# Patient Record
Sex: Male | Born: 1940 | Race: White | Hispanic: No | Marital: Married | Smoking: Former smoker
Health system: Southern US, Community
[De-identification: ages and names within clinical notes are randomized; demographics above are authoritative.]

## PROBLEM LIST (undated history)

## (undated) DIAGNOSIS — M1990 Unspecified osteoarthritis, unspecified site: Secondary | ICD-10-CM

## (undated) DIAGNOSIS — N400 Enlarged prostate without lower urinary tract symptoms: Secondary | ICD-10-CM

## (undated) DIAGNOSIS — K219 Gastro-esophageal reflux disease without esophagitis: Secondary | ICD-10-CM

## (undated) DIAGNOSIS — T7840XA Allergy, unspecified, initial encounter: Secondary | ICD-10-CM

## (undated) DIAGNOSIS — I10 Essential (primary) hypertension: Secondary | ICD-10-CM

## (undated) DIAGNOSIS — Z800 Family history of malignant neoplasm of digestive organs: Secondary | ICD-10-CM

## (undated) HISTORY — DX: Essential (primary) hypertension: I10

## (undated) HISTORY — PX: BLEPHAROPLASTY: SUR158

## (undated) HISTORY — DX: Family history of malignant neoplasm of digestive organs: Z80.0

## (undated) HISTORY — DX: Gastro-esophageal reflux disease without esophagitis: K21.9

## (undated) HISTORY — PX: COLONOSCOPY WITH PROPOFOL: SHX5780

## (undated) HISTORY — DX: Benign prostatic hyperplasia without lower urinary tract symptoms: N40.0

## (undated) HISTORY — DX: Allergy, unspecified, initial encounter: T78.40XA

---
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
## 2007-12-07 ENCOUNTER — Ambulatory Visit: Payer: Self-pay | Admitting: Gastroenterology

## 2007-12-17 ENCOUNTER — Ambulatory Visit: Payer: Self-pay | Admitting: Gastroenterology

## 2012-10-10 ENCOUNTER — Encounter: Payer: Self-pay | Admitting: *Deleted

## 2012-10-17 ENCOUNTER — Encounter: Payer: Self-pay | Admitting: Gastroenterology

## 2013-03-20 ENCOUNTER — Encounter: Payer: Self-pay | Admitting: Gastroenterology

## 2013-05-14 ENCOUNTER — Ambulatory Visit (AMBULATORY_SURGERY_CENTER): Payer: Self-pay | Admitting: *Deleted

## 2013-05-14 VITALS — Ht 68.0 in | Wt 177.6 lb

## 2013-05-14 DIAGNOSIS — Z800 Family history of malignant neoplasm of digestive organs: Secondary | ICD-10-CM

## 2013-05-14 MED ORDER — MOVIPREP 100 G PO SOLR: ORAL | Status: DC

## 2013-05-14 NOTE — Progress Notes (Signed)
No allergies to eggs or soy. No problems with anesthesia.  

## 2013-05-20 ENCOUNTER — Encounter: Payer: Self-pay | Admitting: Gastroenterology

## 2013-05-24 ENCOUNTER — Encounter: Payer: Self-pay | Admitting: Gastroenterology

## 2013-05-24 ENCOUNTER — Ambulatory Visit (AMBULATORY_SURGERY_CENTER): Payer: Medicare Other | Admitting: Gastroenterology

## 2013-05-24 VITALS — BP 141/71 | HR 66 | Temp 97.1°F | Resp 18 | Ht 68.0 in | Wt 177.0 lb

## 2013-05-24 DIAGNOSIS — D126 Benign neoplasm of colon, unspecified: Secondary | ICD-10-CM

## 2013-05-24 DIAGNOSIS — Z1211 Encounter for screening for malignant neoplasm of colon: Secondary | ICD-10-CM

## 2013-05-24 DIAGNOSIS — Z800 Family history of malignant neoplasm of digestive organs: Secondary | ICD-10-CM

## 2013-05-24 MED ORDER — SODIUM CHLORIDE 0.9 % IV SOLN: 500.0000 mL | INTRAVENOUS | Status: DC

## 2013-05-24 NOTE — Progress Notes (Signed)
Called to room to assist during endoscopic procedure.  Patient ID and intended procedure confirmed with present staff. Received instructions for my participation in the procedure from the performing physician.  

## 2013-05-24 NOTE — Patient Instructions (Signed)
YOU HAD AN ENDOSCOPIC PROCEDURE TODAY AT THE [facility]: Refer to the procedure report that was given to you for any specific questions about what was found during the examination.  If the procedure report does not answer your questions, please call your gastroenterologist to clarify.  If you requested that your care partner not be given the details of your procedure findings, then the procedure report has been included in a sealed envelope for you to review at your convenience later.  YOU SHOULD EXPECT: Some feelings of bloating in the abdomen. Passage of more gas than usual.  Walking can help get rid of the air that was put into your GI tract during the procedure and reduce the bloating. If you had a lower endoscopy (such as a colonoscopy or flexible sigmoidoscopy) you may notice spotting of blood in your stool or on the toilet paper. If you underwent a bowel prep for your procedure, then you may not have a normal bowel movement for a few days.  DIET: Your first meal following the procedure should be a light meal and then it is ok to progress to your normal diet.  A half-sandwich or bowl of soup is an example of a good first meal.  Heavy or fried foods are harder to digest and may make you feel nauseous or bloated.  Likewise meals heavy in dairy and vegetables can cause extra gas to form and this can also increase the bloating.  Drink plenty of fluids but you should avoid alcoholic beverages for 24 hours.  ACTIVITY: Your care partner should take you home directly after the procedure.  You should plan to take it easy, moving slowly for the rest of the day.  You can resume normal activity the day after the procedure however you should NOT DRIVE or use heavy machinery for 24 hours (because of the sedation medicines used during the test).    SYMPTOMS TO REPORT IMMEDIATELY: A gastroenterologist can be reached at any hour.  During normal business hours, 8:30 AM to 5:00 PM Monday through Friday,  call [phone].  After hours and on weekends, please call the GI answering service at [phone] who will take a message and have the physician on call contact you.   Following lower endoscopy (colonoscopy or flexible sigmoidoscopy):  Excessive amounts of blood in the stool  Significant tenderness or worsening of abdominal pains  Swelling of the abdomen that is new, acute  Fever of 100F or higher  FOLLOW UP: If any biopsies were taken you will be contacted by phone or by letter within the next 1-3 weeks.  Call your gastroenterologist if you have not heard about the biopsies in 3 weeks.  Our staff will call the home number listed on your records the next business day following your procedure to check on you and address any questions or concerns that you may have at that time regarding the information given to you following your procedure. This is a courtesy call and so if there is no answer at the home number and we have not heard from you through the emergency physician on call, we will assume that you have returned to your regular daily activities without incident.  SIGNATURES/CONFIDENTIALITY: You and/or your care partner have signed paperwork which will be entered into your electronic medical record.  These signatures attest to the fact that that the information above on your After Visit Summary has been reviewed and is understood.  Full responsibility of the confidentiality of this   discharge information lies with you and/or your care-partner.  Recommendations See procedure report  

## 2013-05-24 NOTE — Op Note (Signed)
[facility] [address] [place], [address]   COLONOSCOPY PROCEDURE REPORT  PATIENT: John Long, John Long  MR#: [id number] BIRTHDATE: May 05, 1941 , 72  yrs. old GENDER: Male ENDOSCOPIST: Mardella Layman, MD, [facility] REFERRED BY: PROCEDURE DATE:  05/24/2013 PROCEDURE:   Colonoscopy, screening First Screening Colonoscopy - Avg.  risk and is 50 yrs.  old or older - No.      History of Adenoma - Now for follow-up colonoscopy & has been > or = to 3 yrs.  N/A  Polyps Removed Today? Yes. ASA CLASS:   Class II INDICATIONS:Patient's immediate family history of colon cancer. MEDICATIONS: propofol (Diprivan) 200mg  IV  DESCRIPTION OF PROCEDURE:   After the risks benefits and alternatives of the procedure were thoroughly explained, informed consent was obtained.  A digital rectal exam revealed no abnormalities of the rectum.   The LB [id number]  endoscope was introduced through the anus and advanced to the cecum, which was identified by both the appendix and ileocecal valve. No adverse events experienced.   Limited by a tortuous and redundant colon. The quality of the prep was excellent, using MoviPrep  The instrument was then slowly withdrawn as the colon was fully examined.      COLON FINDINGS: A smooth sessile polyp was found at the cecum.  A polypectomy was performed with cold forceps.  The resection was complete and the polyp tissue was completely retrieved. Retroflexed views revealed no abnormalities. The time to cecum=6 minutes 44 seconds.  Withdrawal time=6 minutes 0 seconds.  The scope was withdrawn and the procedure completed. COMPLICATIONS: There were no complications.  ENDOSCOPIC IMPRESSION: 1.   Sessile polyp was found at the cecum; polypectomy was performed with cold forceps 2.   Normal colonoscopy otherwise  RECOMMENDATIONS: 1.  Await pathology results 2.  Repeat Colonoscopy in 5 years.   eSigned:  Mardella Layman, MD, [facility] 05/24/2013  1:54 PM   cc:

## 2013-05-27 ENCOUNTER — Telehealth: Payer: Self-pay | Admitting: *Deleted

## 2013-05-27 NOTE — Telephone Encounter (Signed)
  Follow up Call-  Call back number 05/24/2013  Post procedure Call Back phone  # [phone]  Permission to leave phone message Yes     Patient questions:  Do you have a fever, pain , or abdominal swelling? no Pain Score  0 *  Have you tolerated food without any problems? yes  Have you been able to return to your normal activities? yes  Do you have any questions about your discharge instructions: Diet   no Medications  no Follow up visit  no  Do you have questions or concerns about your Care? no  Actions: * If pain score is 4 or above: No action needed, pain <4.

## 2013-05-28 ENCOUNTER — Encounter: Payer: Self-pay | Admitting: Gastroenterology

## 2014-03-07 ENCOUNTER — Encounter: Payer: Self-pay | Admitting: Gastroenterology

## 2017-07-24 HISTORY — PX: HERNIA REPAIR: SHX51

## 2018-02-05 ENCOUNTER — Emergency Department: Payer: Medicare Other

## 2018-02-05 ENCOUNTER — Emergency Department
Admission: EM | Admit: 2018-02-05 | Discharge: 2018-02-05 | Disposition: A | Discharge status: Home / Self Care | Payer: Medicare Other | Attending: Emergency Medicine | Admitting: Emergency Medicine

## 2018-02-05 ENCOUNTER — Other Ambulatory Visit: Payer: Self-pay

## 2018-02-05 ENCOUNTER — Encounter: Payer: Self-pay | Admitting: Emergency Medicine

## 2018-02-05 DIAGNOSIS — Z79899 Other long term (current) drug therapy: Secondary | ICD-10-CM | POA: Insufficient documentation

## 2018-02-05 DIAGNOSIS — W19XXXA Unspecified fall, initial encounter: Secondary | ICD-10-CM | POA: Diagnosis not present

## 2018-02-05 DIAGNOSIS — S52501A Unspecified fracture of the lower end of right radius, initial encounter for closed fracture: Secondary | ICD-10-CM | POA: Insufficient documentation

## 2018-02-05 DIAGNOSIS — S59911A Unspecified injury of right forearm, initial encounter: Secondary | ICD-10-CM | POA: Diagnosis present

## 2018-02-05 DIAGNOSIS — Z87891 Personal history of nicotine dependence: Secondary | ICD-10-CM | POA: Insufficient documentation

## 2018-02-05 DIAGNOSIS — Y998 Other external cause status: Secondary | ICD-10-CM | POA: Insufficient documentation

## 2018-02-05 DIAGNOSIS — Y9239 Other specified sports and athletic area as the place of occurrence of the external cause: Secondary | ICD-10-CM | POA: Diagnosis not present

## 2018-02-05 DIAGNOSIS — Y9354 Activity, bowling: Secondary | ICD-10-CM | POA: Insufficient documentation

## 2018-02-05 DIAGNOSIS — I10 Essential (primary) hypertension: Secondary | ICD-10-CM | POA: Insufficient documentation

## 2018-02-05 IMAGING — DX DG WRIST COMPLETE 3+V*L*
3 series · 3 of 3 positions shown · non-contrast
Comparison: None.

CLINICAL DATA: The patient suffered a fall with a left wrist injury
while bowling today. Initial encounter.

EXAM:
LEFT WRIST - COMPLETE 3+ VIEW

[wrist ap]
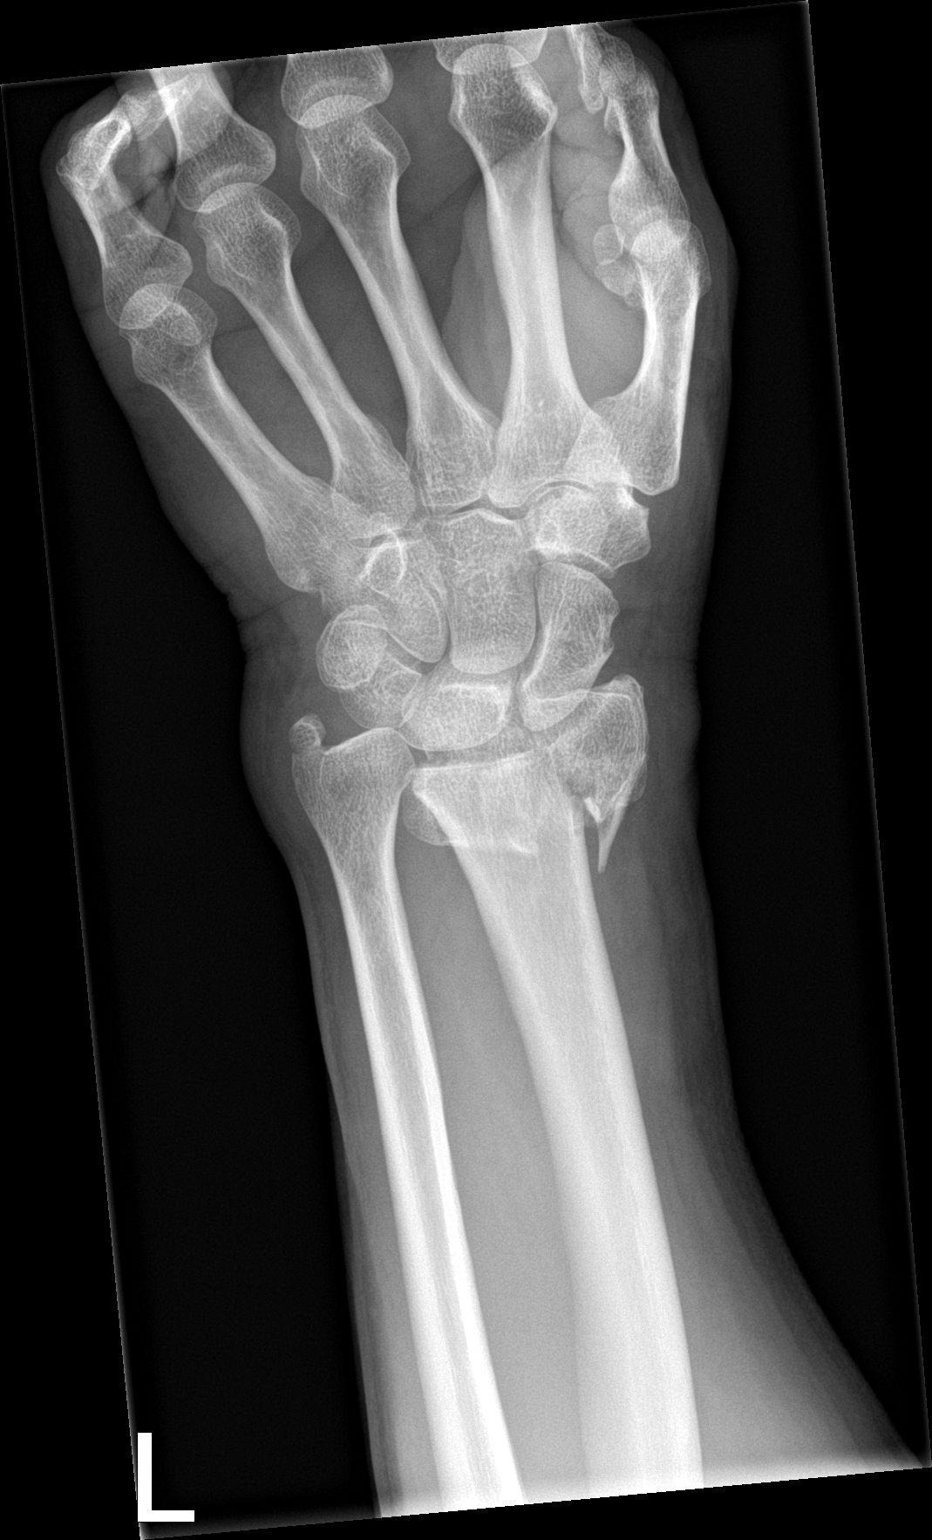

[wrist obl]
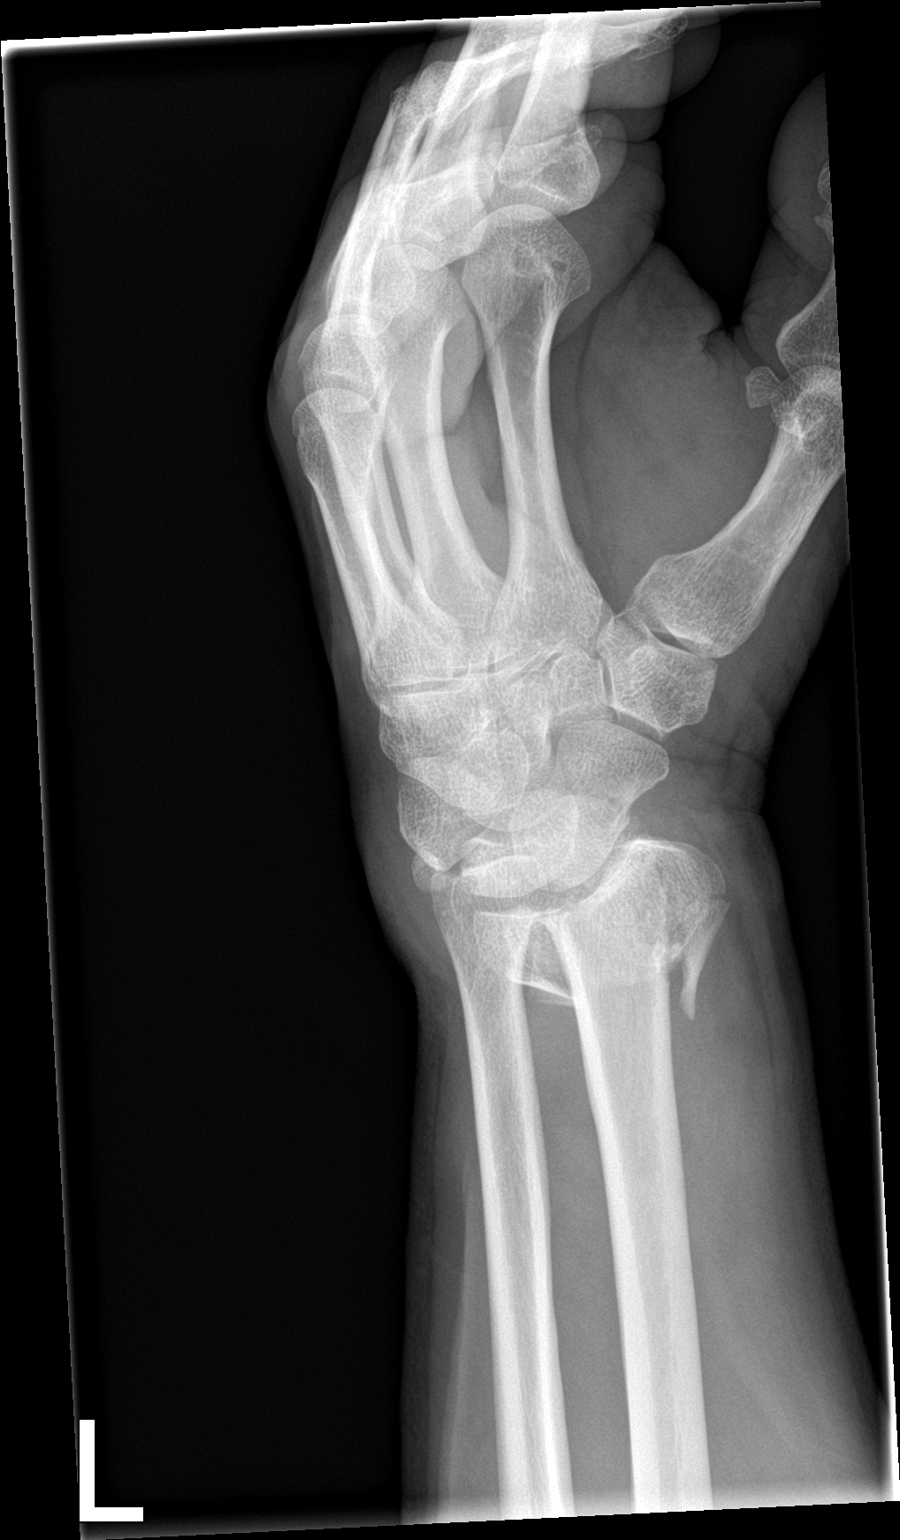

[wrist lat]
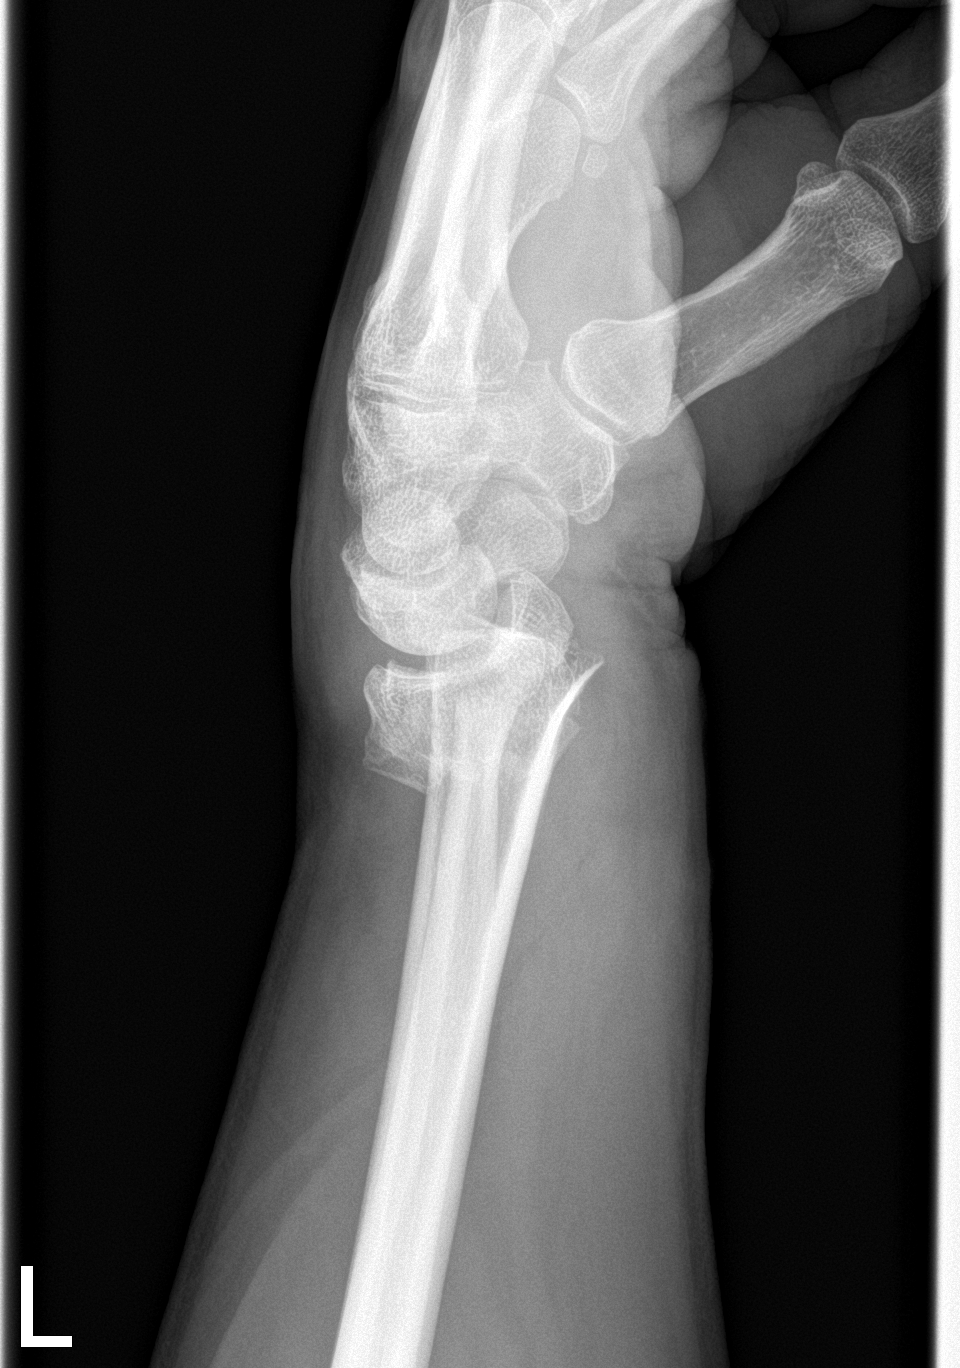

[3 of 3 positions shown; findings below may reference images not displayed]

FINDINGS: The patient has an acute distal radius fracture with dorsal
displacement and angulation. The fracture is comminuted and involves
the articular surface. Nondisplaced fracture of the base of the
radial styloid is also noted. Soft tissue swelling is present about
the wrist.
IMPRESSION: Acute distal radius and ulnar styloid fractures as described above.

## 2018-02-05 MED ORDER — ONDANSETRON 4 MG PO TBDP
4.0000 mg | ORAL_TABLET | Freq: Once | ORAL | Status: AC
  Filled 2018-02-05: qty 1

## 2018-02-05 MED ORDER — HYDROMORPHONE HCL 1 MG/ML IJ SOLN
1.0000 mg | Freq: Once | INTRAMUSCULAR | Status: AC
  Filled 2018-02-05: qty 1

## 2018-02-05 MED ORDER — OXYCODONE-ACETAMINOPHEN 5-325 MG PO TABS: 1.0000 | ORAL_TABLET | ORAL | 0 refills | Status: DC | PRN

## 2018-02-05 NOTE — ED Notes (Signed)
Splint and sling have been applied by ed tech keith.  Sling in place.

## 2018-02-05 NOTE — ED Notes (Signed)
States he would rather not have the dilaudid.  Says his pain is not that bad and as long as he is getting rx for med he will wait and see.

## 2018-02-05 NOTE — ED Notes (Signed)
Has ice pack in place.  Good sensation and circulation to fingers on left hand.  Awaiting xray.

## 2018-02-05 NOTE — ED Provider Notes (Signed)
[facility] Emergency Department Provider Note   ____________________________________________   First MD Initiated Contact with Patient 02/05/18 1234     (approximate)  I have reviewed the triage vital signs and the nursing notes.   HISTORY  Chief Complaint Fall and Arm Injury    HPI John Long. is a 77 y.o. male patient presents with pain and edema to the left wrist secondary to falling while bowling this morning.  Patient denies LOC.  Patient denies loss of sensation.  Patient state decreased range of motion secondary to complaint of pain.  Patient rates pain as a 4/10.  Patient described pain is "aching".  Ice was applied prior to arrival.  Past Medical History:  Diagnosis Date  . Allergy   . Enlarged prostate   . Family history of colon cancer   . GERD (gastroesophageal reflux disease)   . Hypertension     There are no active problems to display for this patient.   Past Surgical History:  Procedure Laterality Date  . TONSILLECTOMY  1949    Prior to Admission medications   Medication Sig Start Date End Date Taking? Authorizing Provider  amLODipine (NORVASC) 2.5 MG tablet Take 2.5 mg by mouth daily.   Yes [provider]  finasteride (PROSCAR) 5 MG tablet Take 5 mg by mouth daily.   Yes [provider]  ibuprofen (ADVIL,MOTRIN) 400 MG tablet Take 400 mg by mouth every 6 (six) hours as needed.   Yes [provider]  loratadine (CLARITIN) 10 MG tablet Take 10 mg by mouth daily as needed for allergies.   Yes [provider]  sildenafil (REVATIO) 20 MG tablet Take 20 mg by mouth as needed.   Yes [provider]  sodium chloride 1 g tablet Take 1 g by mouth 3 (three) times daily.   Yes [provider]  telmisartan (MICARDIS) 80 MG tablet Take 80 mg by mouth daily.   Yes [provider]  aspirin 81 MG tablet Take 81 mg by mouth daily.    [provider]  cetirizine  (ZYRTEC) 10 MG tablet Take 10 mg by mouth daily as needed for allergies.    [provider]  losartan-hydrochlorothiazide (HYZAAR) 100-25 MG per tablet Take 1 tablet by mouth daily.    [provider]  Multiple Vitamin (MULTIVITAMIN) tablet Take 1 tablet by mouth daily.    [provider]  omeprazole (PRILOSEC) 20 MG capsule Take 20 mg by mouth daily.    [provider]  oxyCODONE-acetaminophen (PERCOCET) 5-325 MG tablet Take 1 tablet by mouth every 4 (four) hours as needed for severe pain. 02/05/18 02/05/19  Sable Feil, PA-C    Allergies Patient has no known allergies.  Family History  Problem Relation Age of Onset  . Colon cancer Father 75  . Esophageal cancer Neg Hx   . Stomach cancer Neg Hx   . Rectal cancer Neg Hx     Social History Social History   Tobacco Use  . Smoking status: Former Smoker    Last attempt to quit: 06/20/1992    Years since quitting: 25.6  . Smokeless tobacco: Never Used  Substance Use Topics  . Alcohol use: Yes    Alcohol/week: 14.0 standard drinks    Types: 14 Glasses of wine per week  . Drug use: No    Review of Systems Constitutional: No fever/chills Eyes: No visual changes. ENT: No sore throat. Cardiovascular: Denies chest pain.  Hypertension Respiratory: Denies  shortness of breath. Gastrointestinal: No abdominal pain.  No nausea, no vomiting.  No diarrhea.  No constipation. Genitourinary: Negative for dysuria.  BPH Musculoskeletal: Negative for back pain. Skin: Negative for rash. Neurological: Negative for headaches, focal weakness or numbness. Endocrine:Hypertension   ____________________________________________   PHYSICAL EXAM:  VITAL SIGNS: ED Triage Vitals  Enc Vitals Group     BP 02/05/18 1217 (!) 151/92     Pulse Rate 02/05/18 1217 72     Resp 02/05/18 1217 18     Temp 02/05/18 1217 98.2 F (36.8 C)     Temp Source 02/05/18 1217 Oral     SpO2 02/05/18 1217 98 %     Weight 02/05/18  1203 168 lb (76.2 kg)     Height 02/05/18 1203 5\' 7"  (1.702 m)     Head Circumference --      Peak Flow --      Pain Score 02/05/18 1202 4     Pain Loc --      Pain Edu? --      Excl. in [place]? --    Constitutional: Alert and oriented. Well appearing and in no acute distress. Eyes: Conjunctivae are normal. PERRL. EOMI. Head: Atraumatic. Neck: No stridor.  No cervical spine tenderness to palpation. Hematological/Lymphatic/Immunilogical: No cervical lymphadenopathy. Cardiovascular: Normal rate, regular rhythm. Grossly normal heart sounds.  Good peripheral circulation. Respiratory: Normal respiratory effort.  No retractions. Lungs CTAB. Musculoskeletal: Obvious deformity to the distal radius left wrist. Neurologic:  Normal speech and language. No gross focal neurologic deficits are appreciated. No gait instability. Skin:  Skin is warm, dry and intact. No rash noted. Psychiatric: Mood and affect are normal. Speech and behavior are normal.  ____________________________________________   LABS (all labs ordered are listed, but only abnormal results are displayed)  Labs Reviewed - No data to display ____________________________________________  EKG   ____________________________________________  RADIOLOGY  ED MD interpretation:    Official radiology report(s): Dg Wrist Complete Left  Result Date: 02/05/2018 CLINICAL DATA:  The patient suffered a fall with a left wrist injury while bowling today. Initial encounter. EXAM: LEFT WRIST - COMPLETE 3+ VIEW COMPARISON:  None. FINDINGS: The patient has an acute distal radius fracture with dorsal displacement and angulation. The fracture is comminuted and involves the articular surface. Nondisplaced fracture of the base of the radial styloid is also noted. Soft tissue swelling is present about the wrist. IMPRESSION: Acute distal radius and ulnar styloid fractures as described above. Electronically Signed   By: Inge Rise M.D.   On:  02/05/2018 12:30    ____________________________________________   PROCEDURES  Procedure(s) performed:   .Splint Application Date/Time: 7/34/1937 12:49 PM Performed by: Luciana Axe, NT Authorized by: Sable Feil, PA-C   Consent:    Consent obtained:  Verbal   Consent given by:  Patient   Risks discussed:  Numbness, pain and swelling Pre-procedure details:    Sensation:  Normal Procedure details:    Laterality:  Left   Location:  Wrist   Wrist:  L wrist   Cast type:  Short arm   Supplies:  Ortho-Glass and sling Post-procedure details:    Pain:  Unchanged   Sensation:  Normal   Patient tolerance of procedure:  Tolerated well, no immediate complications    Critical Care performed: No  ____________________________________________   INITIAL IMPRESSION / ASSESSMENT AND PLAN / ED COURSE  As part of my medical decision making, I reviewed the following data within the [place]  Left wrist pain secondary to distal radius fracture with dorsal displacement angulation.  Discussed patient with Dr. Marry Guan, who is the orthopedic on-call.  Advised to splint and sling.  Patient will follow-up with orthopedic clinic by calling for an appointment this afternoon.      ____________________________________________   FINAL CLINICAL IMPRESSION(S) / ED DIAGNOSES  Final diagnoses:  Closed fracture of distal end of right radius, unspecified fracture morphology, initial encounter     ED Discharge Orders         Ordered    oxyCODONE-acetaminophen (PERCOCET) 5-325 MG tablet  Every 4 hours PRN     02/05/18 1257           Note:  This document was prepared using Dragon voice recognition software and may include unintentional dictation errors.    Sable Feil, PA-C 02/05/18 1305    Harvest Dark, MD 02/05/18 1410

## 2018-02-05 NOTE — ED Triage Notes (Signed)
Pt reports was bowling before coming here and fell hurting his left wrist. Pt with swelling and obvious deformity noted to left wrist. Denies LOC

## 2018-02-05 NOTE — Discharge Instructions (Signed)
Wear splint and sling until evaluation by Ortho clinic.  Call this afternoon to schedule an appointment.  Advised that you are a follow-up from the emergency room.

## 2018-02-07 MED ORDER — CEFAZOLIN SODIUM-DEXTROSE 2-4 GM/100ML-% IV SOLN
2.0000 g | Freq: Once | INTRAVENOUS | Status: AC
  Administered 2018-02-08: 2 g via INTRAVENOUS

## 2018-02-08 ENCOUNTER — Encounter
Admission: RE | Disposition: A | Discharge status: Home / Self Care | Payer: Self-pay | Source: Ambulatory Visit | Attending: Orthopedic Surgery

## 2018-02-08 ENCOUNTER — Ambulatory Visit: Payer: Medicare Other | Admitting: Anesthesiology

## 2018-02-08 ENCOUNTER — Ambulatory Visit
Admission: RE | Admit: 2018-02-08 | Discharge: 2018-02-08 | Disposition: A | Discharge status: Home / Self Care | Payer: Medicare Other | Source: Ambulatory Visit | Attending: Orthopedic Surgery | Admitting: Orthopedic Surgery

## 2018-02-08 ENCOUNTER — Other Ambulatory Visit: Payer: Self-pay

## 2018-02-08 ENCOUNTER — Ambulatory Visit: Payer: Medicare Other

## 2018-02-08 DIAGNOSIS — W1839XA Other fall on same level, initial encounter: Secondary | ICD-10-CM | POA: Diagnosis not present

## 2018-02-08 DIAGNOSIS — I10 Essential (primary) hypertension: Secondary | ICD-10-CM | POA: Diagnosis not present

## 2018-02-08 DIAGNOSIS — Z888 Allergy status to other drugs, medicaments and biological substances status: Secondary | ICD-10-CM | POA: Diagnosis not present

## 2018-02-08 DIAGNOSIS — Z8781 Personal history of (healed) traumatic fracture: Secondary | ICD-10-CM

## 2018-02-08 DIAGNOSIS — K219 Gastro-esophageal reflux disease without esophagitis: Secondary | ICD-10-CM | POA: Diagnosis not present

## 2018-02-08 DIAGNOSIS — Z79899 Other long term (current) drug therapy: Secondary | ICD-10-CM | POA: Diagnosis not present

## 2018-02-08 DIAGNOSIS — Z87891 Personal history of nicotine dependence: Secondary | ICD-10-CM | POA: Diagnosis not present

## 2018-02-08 DIAGNOSIS — Z7982 Long term (current) use of aspirin: Secondary | ICD-10-CM | POA: Diagnosis not present

## 2018-02-08 DIAGNOSIS — S52572A Other intraarticular fracture of lower end of left radius, initial encounter for closed fracture: Secondary | ICD-10-CM | POA: Diagnosis present

## 2018-02-08 DIAGNOSIS — Y929 Unspecified place or not applicable: Secondary | ICD-10-CM | POA: Insufficient documentation

## 2018-02-08 DIAGNOSIS — Z9889 Other specified postprocedural states: Secondary | ICD-10-CM

## 2018-02-08 DIAGNOSIS — Y9354 Activity, bowling: Secondary | ICD-10-CM | POA: Insufficient documentation

## 2018-02-08 HISTORY — PX: OPEN REDUCTION INTERNAL FIXATION (ORIF) DISTAL RADIAL FRACTURE: SHX5989

## 2018-02-08 LAB — CBC WITH DIFFERENTIAL/PLATELET
BASOS PCT: 0 %
Basophils Absolute: 0 10*3/uL (ref 0–0.1)
EOS ABS: 0.2 10*3/uL (ref 0–0.7)
Eosinophils Relative: 2 %
HCT: 41.6 % (ref 40.0–52.0)
HEMOGLOBIN: 14.2 g/dL (ref 13.0–18.0)
Lymphocytes Relative: 13 %
Lymphs Abs: 1.1 10*3/uL (ref 1.0–3.6)
MCH: 29.9 pg (ref 26.0–34.0)
MCHC: 34 g/dL (ref 32.0–36.0)
MCV: 87.9 fL (ref 80.0–100.0)
MONO ABS: 0.6 10*3/uL (ref 0.2–1.0)
Monocytes Relative: 7 %
NEUTROS PCT: 78 %
Neutro Abs: 6.9 10*3/uL — ABNORMAL HIGH (ref 1.4–6.5)
PLATELETS: 290 10*3/uL (ref 150–440)
RBC: 4.73 MIL/uL (ref 4.40–5.90)
RDW: 13.2 % (ref 11.5–14.5)
WBC: 8.8 10*3/uL (ref 3.8–10.6)

## 2018-02-08 LAB — BASIC METABOLIC PANEL
Anion gap: 11 (ref 5–15)
BUN: 15 mg/dL (ref 8–23)
CALCIUM: 9.2 mg/dL (ref 8.9–10.3)
CO2: 28 mmol/L (ref 22–32)
CREATININE: 0.86 mg/dL (ref 0.61–1.24)
Chloride: 97 mmol/L — ABNORMAL LOW (ref 98–111)
Glucose, Bld: 111 mg/dL — ABNORMAL HIGH (ref 70–99)
Potassium: 4.5 mmol/L (ref 3.5–5.1)
SODIUM: 136 mmol/L (ref 135–145)

## 2018-02-08 IMAGING — DX DG WRIST 2V*L*
2 series · 2 of 2 positions shown · non-contrast
Comparison: None.

CLINICAL DATA: ORIF LEFT wrist fracture

EXAM:
LEFT WRIST - 2 VIEW

[wrist ap]
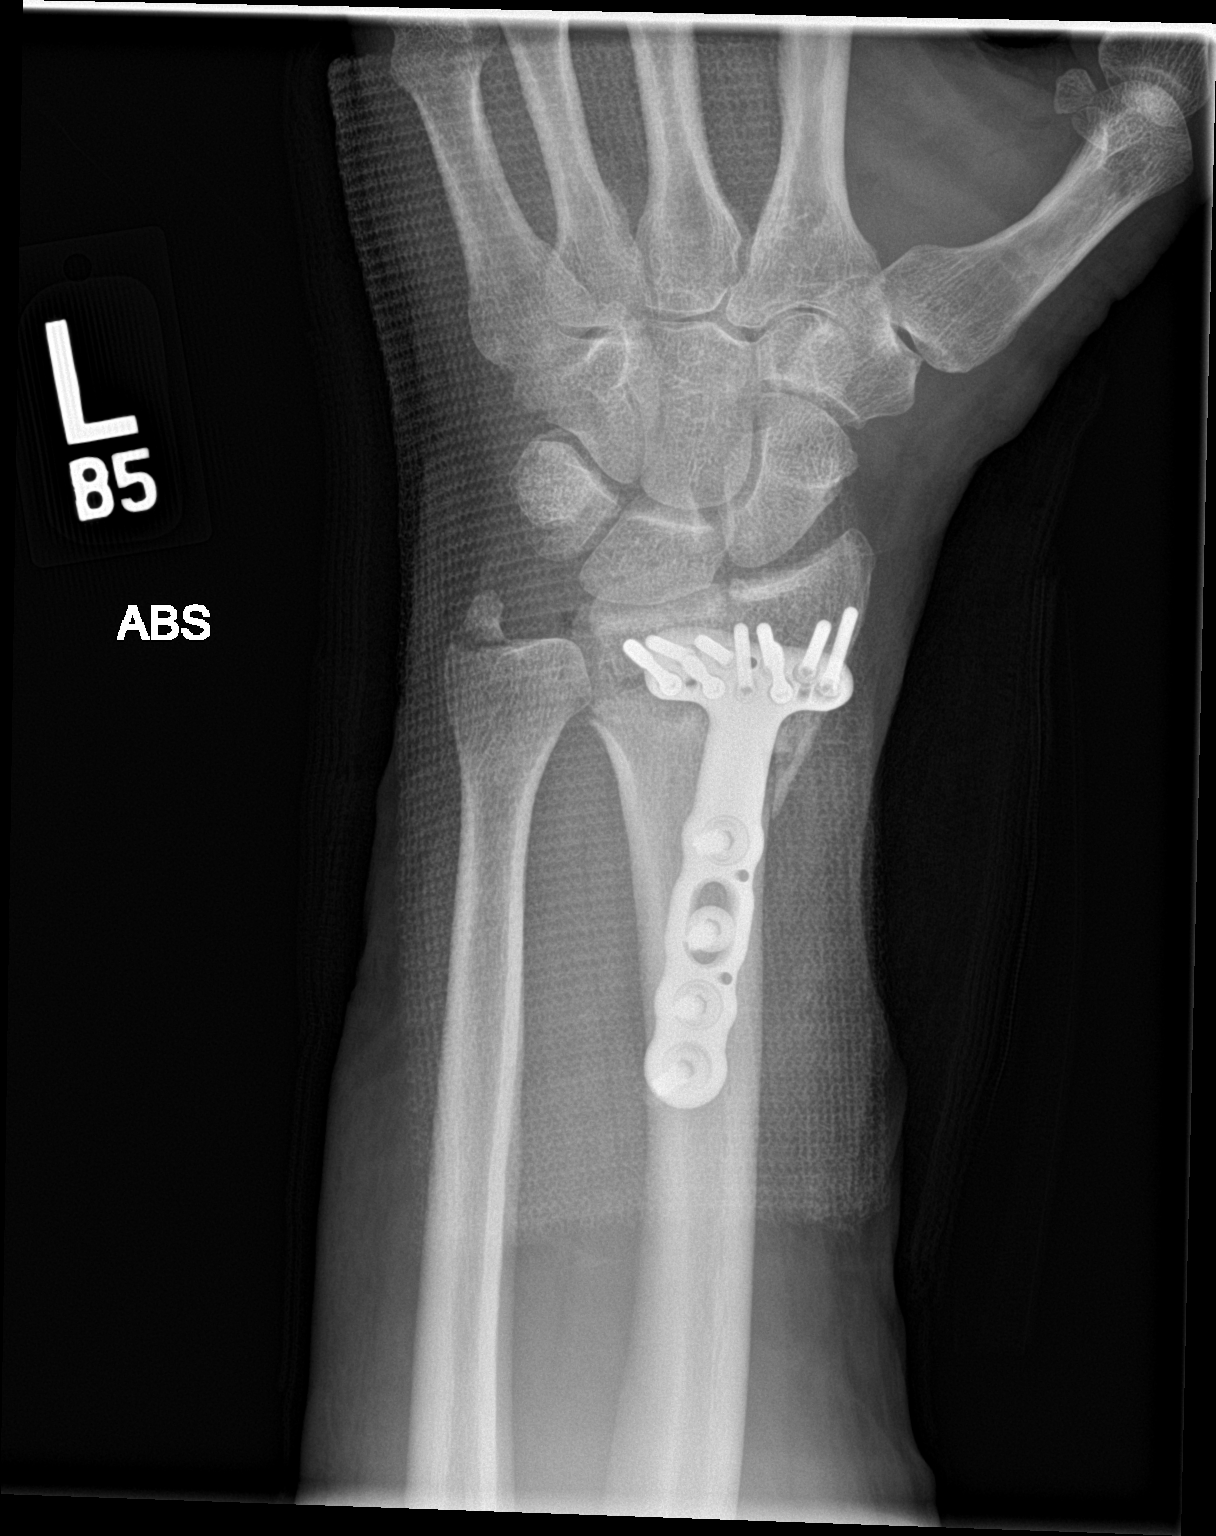

[wrist lat]
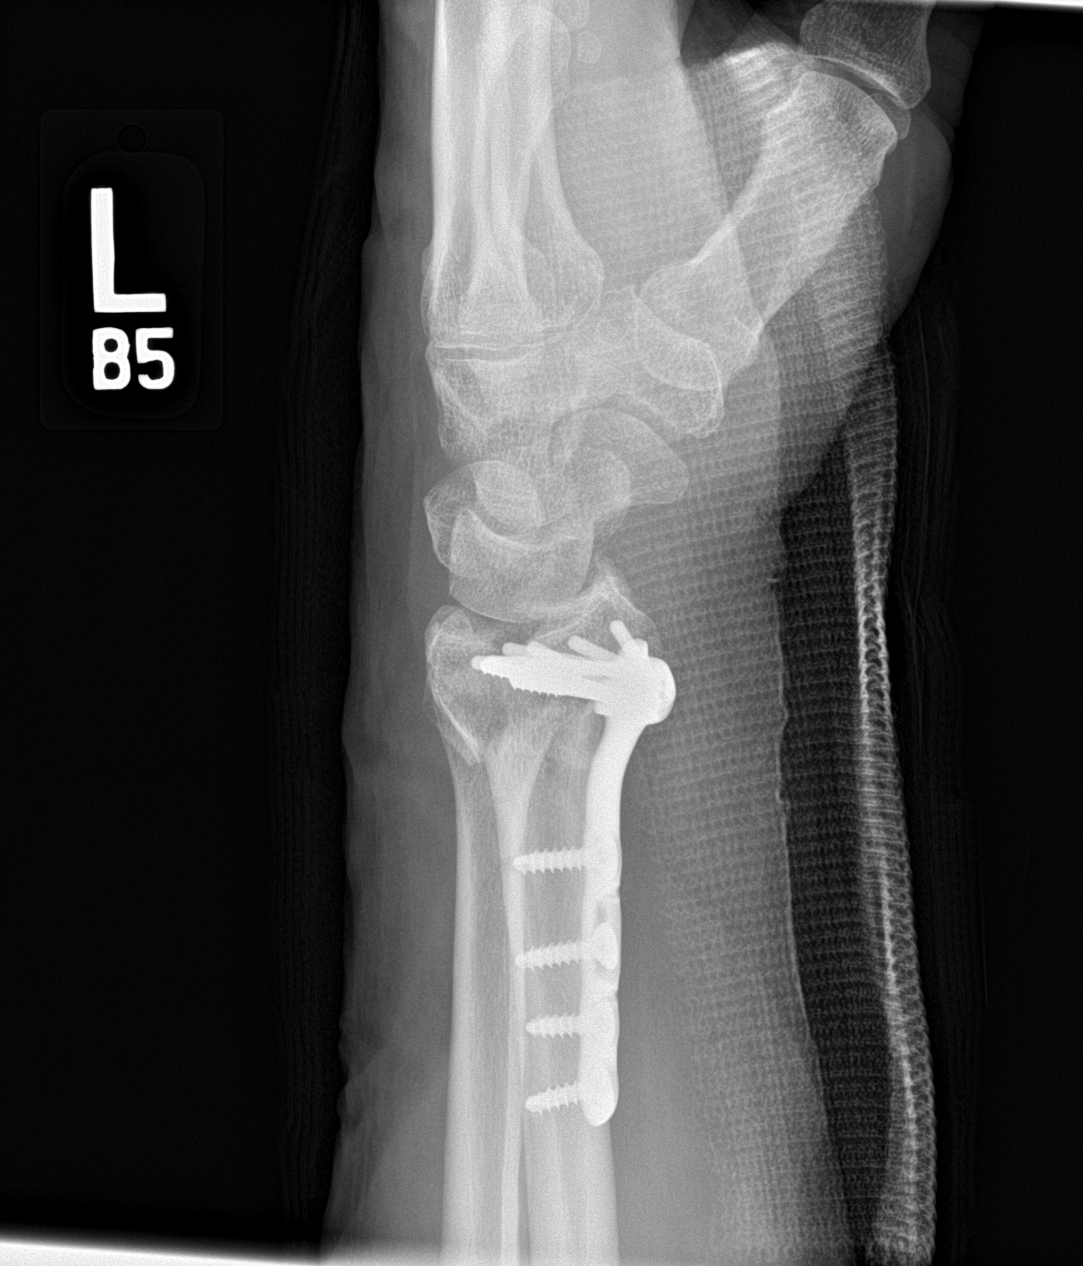

[2 of 2 positions shown; findings below may reference images not displayed]

FINDINGS: Internal plate and screw fixation traverses a comminuted
intra-articular distal radial fracture, in near-anatomic alignment
and position. No gross complicating features are noted.

Ulnar styloid fracture again noted.
IMPRESSION: ORIF distal radial fracture, in near-anatomic alignment and
position.

## 2018-02-08 SURGERY — OPEN REDUCTION INTERNAL FIXATION (ORIF) DISTAL RADIUS FRACTURE
Anesthesia: General | Site: Wrist | Laterality: Left | Wound class: Clean

## 2018-02-08 MED ORDER — FENTANYL CITRATE (PF) 100 MCG/2ML IJ SOLN
INTRAMUSCULAR | Status: DC | PRN
  Administered 2018-02-08: 50 ug via INTRAVENOUS
  Administered 2018-02-08 (×2): 25 ug via INTRAVENOUS

## 2018-02-08 MED ORDER — HYDROCODONE-ACETAMINOPHEN 5-325 MG PO TABS: 1.0000 | ORAL_TABLET | ORAL | Status: DC | PRN

## 2018-02-08 MED ORDER — NEOMYCIN-POLYMYXIN B GU 40-200000 IR SOLN
Status: DC | PRN
  Administered 2018-02-08: 2 mL

## 2018-02-08 MED ORDER — PROPOFOL 500 MG/50ML IV EMUL
INTRAVENOUS | Status: DC | PRN
  Administered 2018-02-08: 50 ug/kg/min via INTRAVENOUS

## 2018-02-08 MED ORDER — FENTANYL CITRATE (PF) 100 MCG/2ML IJ SOLN
INTRAMUSCULAR | Status: AC
  Administered 2018-02-08: 50 ug via INTRAVENOUS
  Filled 2018-02-08: qty 2

## 2018-02-08 MED ORDER — PROPOFOL 10 MG/ML IV BOLUS
INTRAVENOUS | Status: AC
  Filled 2018-02-08: qty 20

## 2018-02-08 MED ORDER — FENTANYL CITRATE (PF) 100 MCG/2ML IJ SOLN: 25.0000 ug | INTRAMUSCULAR | Status: DC | PRN

## 2018-02-08 MED ORDER — ROPIVACAINE HCL 5 MG/ML IJ SOLN
INTRAMUSCULAR | Status: AC
  Filled 2018-02-08: qty 30

## 2018-02-08 MED ORDER — ONDANSETRON HCL 4 MG/2ML IJ SOLN: 4.0000 mg | Freq: Four times a day (QID) | INTRAMUSCULAR | Status: DC | PRN

## 2018-02-08 MED ORDER — FENTANYL CITRATE (PF) 100 MCG/2ML IJ SOLN
50.0000 ug | Freq: Once | INTRAMUSCULAR | Status: AC
  Administered 2018-02-08: 50 ug via INTRAVENOUS

## 2018-02-08 MED ORDER — MIDAZOLAM HCL 2 MG/2ML IJ SOLN
INTRAMUSCULAR | Status: AC
  Administered 2018-02-08: 1 mg via INTRAVENOUS
  Filled 2018-02-08: qty 2

## 2018-02-08 MED ORDER — OXYCODONE HCL 5 MG/5ML PO SOLN: 5.0000 mg | Freq: Once | ORAL | Status: DC | PRN

## 2018-02-08 MED ORDER — LIDOCAINE HCL (PF) 1 % IJ SOLN
INTRAMUSCULAR | Status: DC | PRN
  Administered 2018-02-08: 1 mL via INTRADERMAL

## 2018-02-08 MED ORDER — LIDOCAINE HCL (PF) 1 % IJ SOLN
INTRAMUSCULAR | Status: AC
  Filled 2018-02-08: qty 5

## 2018-02-08 MED ORDER — DEXAMETHASONE SODIUM PHOSPHATE 10 MG/ML IJ SOLN
INTRAMUSCULAR | Status: AC
  Filled 2018-02-08: qty 1

## 2018-02-08 MED ORDER — DEXMEDETOMIDINE HCL IN NACL 200 MCG/50ML IV SOLN
INTRAVENOUS | Status: DC | PRN
  Administered 2018-02-08: 4 ug via INTRAVENOUS
  Administered 2018-02-08: 8 ug via INTRAVENOUS
  Administered 2018-02-08 (×2): 4 ug via INTRAVENOUS

## 2018-02-08 MED ORDER — MIDAZOLAM HCL 2 MG/2ML IJ SOLN
1.0000 mg | Freq: Once | INTRAMUSCULAR | Status: AC
  Administered 2018-02-08: 1 mg via INTRAVENOUS

## 2018-02-08 MED ORDER — GLYCOPYRROLATE 0.2 MG/ML IJ SOLN
INTRAMUSCULAR | Status: AC
  Filled 2018-02-08: qty 1

## 2018-02-08 MED ORDER — METOCLOPRAMIDE HCL 10 MG PO TABS: 5.0000 mg | ORAL_TABLET | Freq: Three times a day (TID) | ORAL | Status: DC | PRN

## 2018-02-08 MED ORDER — KETOROLAC TROMETHAMINE 30 MG/ML IJ SOLN
INTRAMUSCULAR | Status: AC
  Filled 2018-02-08: qty 1

## 2018-02-08 MED ORDER — FENTANYL CITRATE (PF) 100 MCG/2ML IJ SOLN
INTRAMUSCULAR | Status: AC
  Filled 2018-02-08: qty 2

## 2018-02-08 MED ORDER — CEFAZOLIN SODIUM-DEXTROSE 2-4 GM/100ML-% IV SOLN
INTRAVENOUS | Status: AC
  Filled 2018-02-08: qty 100

## 2018-02-08 MED ORDER — ROPIVACAINE HCL 5 MG/ML IJ SOLN
INTRAMUSCULAR | Status: DC | PRN
  Administered 2018-02-08: 7 mL via PERINEURAL
  Administered 2018-02-08: 13 mL via PERINEURAL

## 2018-02-08 MED ORDER — OXYCODONE HCL 5 MG PO TABS: 5.0000 mg | ORAL_TABLET | Freq: Once | ORAL | Status: DC | PRN

## 2018-02-08 MED ORDER — LIDOCAINE HCL (PF) 2 % IJ SOLN
INTRAMUSCULAR | Status: AC
  Filled 2018-02-08: qty 10

## 2018-02-08 MED ORDER — PROPOFOL 10 MG/ML IV BOLUS
INTRAVENOUS | Status: DC | PRN
  Administered 2018-02-08: 20 mg via INTRAVENOUS
  Administered 2018-02-08: 50 mg via INTRAVENOUS

## 2018-02-08 MED ORDER — ONDANSETRON HCL 4 MG PO TABS: 4.0000 mg | ORAL_TABLET | Freq: Four times a day (QID) | ORAL | Status: DC | PRN

## 2018-02-08 MED ORDER — LACTATED RINGERS IV SOLN
INTRAVENOUS | Status: DC
  Administered 2018-02-08: 11:00:00 via INTRAVENOUS

## 2018-02-08 MED ORDER — LIDOCAINE HCL (PF) 2 % IJ SOLN
INTRAMUSCULAR | Status: DC | PRN
  Administered 2018-02-08: 3 mL via PERINEURAL
  Administered 2018-02-08: 7 mL via PERINEURAL

## 2018-02-08 MED ORDER — ONDANSETRON HCL 4 MG/2ML IJ SOLN
INTRAMUSCULAR | Status: AC
  Filled 2018-02-08: qty 2

## 2018-02-08 MED ORDER — METOCLOPRAMIDE HCL 5 MG/ML IJ SOLN: 5.0000 mg | Freq: Three times a day (TID) | INTRAMUSCULAR | Status: DC | PRN

## 2018-02-08 MED ORDER — SODIUM CHLORIDE 0.9 % IV SOLN: INTRAVENOUS | Status: DC

## 2018-02-08 MED ORDER — FAMOTIDINE 20 MG PO TABS
ORAL_TABLET | ORAL | Status: AC
  Administered 2018-02-08: 20 mg via ORAL
  Filled 2018-02-08: qty 1

## 2018-02-08 MED ORDER — FAMOTIDINE 20 MG PO TABS
20.0000 mg | ORAL_TABLET | Freq: Once | ORAL | Status: AC
  Administered 2018-02-08: 20 mg via ORAL

## 2018-02-08 SURGICAL SUPPLY — 47 items
BANDAGE ACE 4X5 VEL STRL LF (GAUZE/BANDAGES/DRESSINGS) ×3 IMPLANT
BIT DRILL 2 FAST STEP (BIT) ×2 IMPLANT
BIT DRILL 2.5X4 QC (BIT) ×2 IMPLANT
CANISTER SUCT 1200ML W/VALVE (MISCELLANEOUS) ×3 IMPLANT
CHLORAPREP W/TINT 26ML (MISCELLANEOUS) ×3 IMPLANT
CUFF TOURN 18 STER (MISCELLANEOUS) IMPLANT
DRAPE FLUOR MINI C-ARM 54X84 (DRAPES) ×3 IMPLANT
ELECT REM PT RETURN 9FT ADLT (ELECTROSURGICAL) ×3
ELECTRODE REM PT RTRN 9FT ADLT (ELECTROSURGICAL) ×1 IMPLANT
GAUZE PETRO XEROFOAM 1X8 (MISCELLANEOUS) ×6 IMPLANT
GAUZE SPONGE 4X4 12PLY STRL (GAUZE/BANDAGES/DRESSINGS) ×3 IMPLANT
GLOVE SURG SYN 9.0  PF PI (GLOVE) ×2
GLOVE SURG SYN 9.0 PF PI (GLOVE) ×1 IMPLANT
GOWN SRG 2XL LVL 4 RGLN SLV (GOWNS) ×1 IMPLANT
GOWN STRL NON-REIN 2XL LVL4 (GOWNS) ×3
GOWN STRL REUS W/ TWL LRG LVL3 (GOWN DISPOSABLE) ×1 IMPLANT
GOWN STRL REUS W/TWL LRG LVL3 (GOWN DISPOSABLE) ×3
K-WIRE 1.6 (WIRE) ×6
K-WIRE FX5X1.6XNS BN SS (WIRE) ×2
KIT TURNOVER KIT A (KITS) ×3 IMPLANT
KWIRE FX5X1.6XNS BN SS (WIRE) IMPLANT
NDL FILTER BLUNT 18X1 1/2 (NEEDLE) ×1 IMPLANT
NEEDLE FILTER BLUNT 18X 1/2SAF (NEEDLE) ×2
NEEDLE FILTER BLUNT 18X1 1/2 (NEEDLE) ×1 IMPLANT
NS IRRIG 500ML POUR BTL (IV SOLUTION) ×3 IMPLANT
PACK EXTREMITY ARMC (MISCELLANEOUS) ×3 IMPLANT
PAD CAST CTTN 4X4 STRL (SOFTGOODS) ×2 IMPLANT
PADDING CAST COTTON 4X4 STRL (SOFTGOODS) ×6
PEG SUBCHONDRAL SMOOTH 2.0X16 (Peg) ×2 IMPLANT
PEG SUBCHONDRAL SMOOTH 2.0X18 (Peg) ×2 IMPLANT
PEG SUBCHONDRAL SMOOTH 2.0X20 (Peg) ×2 IMPLANT
PEG SUBCHONDRAL SMOOTH 2.0X22 (Peg) ×2 IMPLANT
PEG SUBCHONDRAL SMOOTH 2.0X24 (Peg) ×2 IMPLANT
PEG SUBCHONDRAL SMOOTH 2.0X26 (Peg) ×2 IMPLANT
PLATE WIDE 28.2X62.6 LT (Plate) ×2 IMPLANT
SCALPEL PROTECTED #15 DISP (BLADE) ×6 IMPLANT
SCREW BN 12X3.5XNS CORT TI (Screw) IMPLANT
SCREW CORT 3.5X10 LNG (Screw) ×4 IMPLANT
SCREW CORT 3.5X12 (Screw) ×6 IMPLANT
SCREW MULTI DIRECT 20MM (Screw) ×4 IMPLANT
SCREW MULTI DIRECT 22MM (Screw) ×2 IMPLANT
SPLINT CAST 1 STEP 3X12 (MISCELLANEOUS) ×3 IMPLANT
SUT ETHILON 4-0 (SUTURE) ×3
SUT ETHILON 4-0 FS2 18XMFL BLK (SUTURE) ×1
SUT VICRYL 3-0 27IN (SUTURE) ×3 IMPLANT
SUTURE ETHLN 4-0 FS2 18XMF BLK (SUTURE) ×1 IMPLANT
SYR 3ML LL SCALE MARK (SYRINGE) ×3 IMPLANT

## 2018-02-08 NOTE — Op Note (Signed)
02/08/2018  1:08 PM  PATIENT:  John Long.  77 y.o. male  PRE-OPERATIVE DIAGNOSIS:  CLOSED FRACTURE OF DISTAL END OF LEFT RADIUS, multiple distal fragments  POST-OPERATIVE DIAGNOSIS:  CLOSED FRACTURE OF DISTAL END OF LEFT RADIUS same  PROCEDURE:  Procedure(s): OPEN REDUCTION INTERNAL FIXATION (ORIF) DISTAL RADIAL FRACTURE (Left)  SURGEON: Laurene Footman, MD  ASSISTANTS: None  ANESTHESIA:   paracervical block  EBL:  Total I/O In: 600 [I.V.:600] Out: 20 [Blood:20]  BLOOD ADMINISTERED:none  DRAINS: none   LOCAL MEDICATIONS USED:  NONE  SPECIMEN:  No Specimen  DISPOSITION OF SPECIMEN:  N/A  COUNTS:  YES  TOURNIQUET:  * Missing tourniquet times found for documented tourniquets in log: [id number] *  IMPLANTS: [employer] wide plate left, with multiple smooth pegs multidirectional pegs and screws  DICTATION: .Dragon Dictation patient was brought to the operating room after first having a block placed proximally in recovery room.  The arm was prepped and draped you sterile fashion with appropriate timeout procedure carried out tourniquet was raised to 250 mmHg.  A volar approach was made centered over the FCR tendon with traction applied to the index and middle fingers of the end of the table.  The FCR tendon was retracted radially and the deep tissue was then elevated off the volar and distal fragments.  There were 2 large radial styloid fragments in the brachioradialis was partially elevated off of this to allow for adequate mobilization of the styloid fragments.  There is additionally a volar middle and ulnar fragment which also required alignment and this was done using K wires joysticks with provisional K wire fixation to get distal fragments well aligned.  After this was completed the light was applied and pinned into position at the appropriate level with smooth pegs placed in the distal row and multidirectional screws in the more proximal row followed by bringing the  plate down to the shaft which gave restoration of volar tilt.  After this was completed with the 4 screw holes filled using standard technique drilling measuring and placing self-tapping screws the traction was released and with through range of motion the fracture appeared stable.  There is near anatomic alignment of the radial articular surface with restoration of volar tilt and radial inclination.  Tourniquet was let down and the wound was thoroughly irrigated.  The wound was closed with 3-0 Vicryl subcutaneously and 4-0 skin nylon sutures in a simple interrupted fashion.  Xeroform 4 x 4 web roll and volar splint were applied followed by an Ace wrap  PLAN OF CARE: Discharge to home after PACU  PATIENT DISPOSITION:  PACU - hemodynamically stable.

## 2018-02-08 NOTE — Discharge Instructions (Addendum)
Keep arm elevated as much as possible.  Work on finger range of motion.  Pain medicine as directed.  Keep dressing clean and dry this weekend.    AMBULATORY SURGERY  DISCHARGE INSTRUCTIONS   1) The drugs that you were given will stay in your system until tomorrow so for the next 24 hours you should not:  A) Drive an automobile B) Make any legal decisions C) Drink any alcoholic beverage   2) You may resume regular meals tomorrow.  Today it is better to start with liquids and gradually work up to solid foods.  You may eat anything you prefer, but it is better to start with liquids, then soup and crackers, and gradually work up to solid foods.   3) Please notify your doctor immediately if you have any unusual bleeding, trouble breathing, redness and pain at the surgery site, drainage, fever, or pain not relieved by medication.    4) Additional Instructions:        Please contact your physician with any problems or Same Day Surgery at [phone], Monday through Friday 6 am to 4 pm, or [place] at [facility] number at [phone].

## 2018-02-08 NOTE — H&P (Signed)
Reviewed paper H+P, will be scanned into chart. No changes noted.  

## 2018-02-08 NOTE — Transfer of Care (Signed)
Immediate Anesthesia Transfer of Care Note  Patient: John Long.  Procedure(s) Performed: OPEN REDUCTION INTERNAL FIXATION (ORIF) DISTAL RADIAL FRACTURE (Left Wrist)  Patient Location: PACU  Anesthesia Type:GA combined with regional for post-op pain  Level of Consciousness: awake  Airway & Oxygen Therapy: Patient Spontanous Breathing and Patient connected to nasal cannula oxygen  Post-op Assessment: Report given to RN and Post -op Vital signs reviewed and stable  Post vital signs: Reviewed  Last Vitals:  Vitals Value Taken Time  BP 141/91 02/08/2018  1:05 PM  Temp    Pulse 60 02/08/2018  1:06 PM  Resp 12 02/08/2018  1:06 PM  SpO2 98 % 02/08/2018  1:06 PM  Vitals shown include unvalidated device data.  Last Pain:  Vitals:   02/08/18 1110  TempSrc:   PainSc: 0-No pain         Complications: No apparent anesthesia complications

## 2018-02-08 NOTE — OR Nursing (Signed)
Spouse advised MD told her pt could use ice pack.  Ice pack given with instructions for home use.  Pt. Home with his own sling.

## 2018-02-08 NOTE — Anesthesia Post-op Follow-up Note (Signed)
Anesthesia QCDR form completed.        

## 2018-02-08 NOTE — Anesthesia Postprocedure Evaluation (Signed)
Anesthesia Post Note  Patient: Petar Mucci.  Procedure(s) Performed: OPEN REDUCTION INTERNAL FIXATION (ORIF) DISTAL RADIAL FRACTURE (Left Wrist)  Patient location during evaluation: PACU Anesthesia Type: General Level of consciousness: awake and alert Pain management: pain level controlled Vital Signs Assessment: post-procedure vital signs reviewed and stable Respiratory status: spontaneous breathing, nonlabored ventilation, respiratory function stable and patient connected to nasal cannula oxygen Cardiovascular status: blood pressure returned to baseline and stable Postop Assessment: no apparent nausea or vomiting Anesthetic complications: no     Last Vitals:  Vitals:   02/08/18 1352 02/08/18 1411  BP: (!) 148/90 (!) 144/79  Pulse: 69 62  Resp: 16 16  Temp: 36.7 C   SpO2: 97% 98%    Last Pain:  Vitals:   02/08/18 1411  TempSrc:   PainSc: 0-No pain                 Precious Haws Jovin Fester

## 2018-02-08 NOTE — Anesthesia Procedure Notes (Signed)
Anesthesia Regional Block: Supraclavicular block   Pre-Anesthetic Checklist: ,, timeout performed, Correct Patient, Correct Site, Correct Laterality, Correct Procedure, Correct Position, site marked, Risks and benefits discussed,  Surgical consent,  Pre-op evaluation,  At surgeon's request and post-op pain management  Laterality: Upper and Left  Prep: chloraprep       Needles:  Injection technique: Single-shot  Needle Type: Stimiplex     Needle Length: 5cm  Needle Gauge: 22     Additional Needles:   Procedures:,,,, ultrasound used (permanent image in chart),,,,  Narrative:  Start time: 02/08/2018 10:34 AM End time: 02/08/2018 10:40 AM Injection made incrementally with aspirations every 5 mL.  Performed by: Personally  Anesthesiologist: Akshar Starnes, Precious Haws, MD  Additional Notes: Functioning IV was confirmed and monitors were applied.  A 55mm 22ga Stimuplex needle was used. Sterile prep,hand hygiene and sterile gloves were used.  Minimal sedation used for procedure.  No paresthesia endorsed by patient during the procedure.  Negative aspiration and negative test dose prior to incremental administration of local anesthetic. The patient tolerated the procedure well with no immediate complications.

## 2018-02-08 NOTE — Progress Notes (Signed)
EKG reviewed by Dr. Amie Critchley

## 2018-02-08 NOTE — Anesthesia Preprocedure Evaluation (Signed)
Anesthesia Evaluation  Patient identified by MRN, date of birth, ID band Patient awake    Reviewed: Allergy & Precautions, H&P , NPO status , Patient's Chart, lab work & pertinent test results  History of Anesthesia Complications Negative for: history of anesthetic complications  Airway Mallampati: III  TM Distance: <3 FB Neck ROM: limited    Dental  (+) Chipped   Pulmonary neg shortness of breath, asthma , former smoker,           Cardiovascular Exercise Tolerance: Good hypertension, (-) angina(-) Past MI and (-) DOE      Neuro/Psych negative neurological ROS  negative psych ROS   GI/Hepatic Neg liver ROS, GERD  Medicated and Controlled,  Endo/Other  negative endocrine ROS  Renal/GU negative Renal ROS  negative genitourinary   Musculoskeletal   Abdominal   Peds  Hematology negative hematology ROS (+)   Anesthesia Other Findings Past Medical History: No date: Allergy No date: Enlarged prostate No date: Family history of colon cancer No date: GERD (gastroesophageal reflux disease) No date: Hypertension  Past Surgical History: 1949: TONSILLECTOMY  BMI    Body Mass Index:  26.41 kg/m      Reproductive/Obstetrics negative OB ROS                             Anesthesia Physical Anesthesia Plan  ASA: III  Anesthesia Plan: General   Post-op Pain Management: GA combined w/ Regional for post-op pain   Induction: Intravenous  PONV Risk Score and Plan: Propofol infusion and TIVA  Airway Management Planned: Natural Airway and Nasal Cannula  Additional Equipment:   Intra-op Plan:   Post-operative Plan:   Informed Consent: I have reviewed the patients History and Physical, chart, labs and discussed the procedure including the risks, benefits and alternatives for the proposed anesthesia with the patient or authorized representative who has indicated his/her understanding and  acceptance.   Dental Advisory Given  Plan Discussed with: Anesthesiologist, CRNA and Surgeon  Anesthesia Plan Comments: (Patient consented for risks of anesthesia including but not limited to:  - adverse reactions to medications - risk of intubation if required - damage to teeth, lips or other oral mucosa - sore throat or hoarseness - Damage to heart, brain, lungs or loss of life  Patient voiced understanding.)        Anesthesia Quick Evaluation

## 2018-02-09 ENCOUNTER — Encounter: Payer: Self-pay | Admitting: Orthopedic Surgery

## 2018-04-05 ENCOUNTER — Encounter: Payer: Self-pay | Admitting: Occupational Therapy

## 2018-04-05 ENCOUNTER — Ambulatory Visit: Payer: Medicare Other | Attending: Orthopedic Surgery | Admitting: Occupational Therapy

## 2018-04-05 ENCOUNTER — Other Ambulatory Visit: Payer: Self-pay

## 2018-04-05 DIAGNOSIS — M25632 Stiffness of left wrist, not elsewhere classified: Secondary | ICD-10-CM

## 2018-04-05 DIAGNOSIS — L905 Scar conditions and fibrosis of skin: Secondary | ICD-10-CM | POA: Diagnosis present

## 2018-04-05 DIAGNOSIS — M6281 Muscle weakness (generalized): Secondary | ICD-10-CM | POA: Diagnosis present

## 2018-04-05 DIAGNOSIS — M25532 Pain in left wrist: Secondary | ICD-10-CM | POA: Diagnosis present

## 2018-04-05 DIAGNOSIS — M25642 Stiffness of left hand, not elsewhere classified: Secondary | ICD-10-CM

## 2018-04-05 NOTE — Therapy (Signed)
[facility] [address] [place], [address] Phone: [phone]   Fax:  [phone]  Occupational Therapy Evaluation  Patient Details  Name: John Long. MRN: [id number] Date of Birth: September 04, 1940 Referring Provider (OT): Rudene Christians   Encounter Date: 04/05/2018  OT End of Session - 04/05/18 1923    Visit Number  1    Number of Visits  12    Date for OT Re-Evaluation  05/17/18    OT Start Time  1505    OT Stop Time  1614    OT Time Calculation (min)  69 min    Activity Tolerance  Patient tolerated treatment well    Behavior During Therapy  [facility] for tasks assessed/performed       Past Medical History:  Diagnosis Date  . Allergy   . Enlarged prostate   . Family history of colon cancer   . GERD (gastroesophageal reflux disease)   . Hypertension     Past Surgical History:  Procedure Laterality Date  . OPEN REDUCTION INTERNAL FIXATION (ORIF) DISTAL RADIAL FRACTURE Left 02/08/2018   Procedure: OPEN REDUCTION INTERNAL FIXATION (ORIF) DISTAL RADIAL FRACTURE;  Surgeon: Hessie Knows, MD;  Location: [facility] ORS;  Service: Orthopedics;  Laterality: Left;  . TONSILLECTOMY  1949    There were no vitals filed for this visit.  Subjective Assessment - 04/05/18 1912    Subjective   Dr Rudene Christians told me I can take that splint off - so not wearing it -and did hold a bowling ball the other day on my hand - wiht support - he did not tell me anything I cannot do - told me to work my fingers and come see you - pain in my wrist is not as bad as the pain in my L shoulder and index finger      Patient Stated Goals  Want to be able to use my hand -I am very active in bowling , drive RV and travel , do some woodwork , fix things , help in the house with some tasks     Currently in Pain?  Yes    Pain Score  2     Pain Location  Wrist    Pain Orientation  Left    Pain Descriptors / Indicators  Aching    Pain Type  Surgical pain     Pain Onset  More than a month ago    Pain Frequency  Intermittent        [facility] OT Assessment - 04/05/18 0001      Assessment   Medical Diagnosis  s/p L ORIF distal radius fx     Referring Provider (OT)  Rudene Christians    Onset Date/Surgical Date  02/08/18    Hand Dominance  Right      Home  Environment   Lives With  Spouse      Prior Function   Vocation  Retired    Leisure  Likes to travel , RV, Environmental consultant, woodwork , fix things around the house and help around the house       AROM   Left Forearm Pronation  85 Degrees    Left Forearm Supination  85 Degrees    Right Wrist Extension  60 Degrees    Right Wrist Flexion  80 Degrees    Right Wrist Radial Deviation  20 Degrees    Right Wrist Ulnar Deviation  31 Degrees    Left Wrist Extension  30  Degrees    Left Wrist Flexion  40 Degrees    Left Wrist Radial Deviation  12 Degrees    Left Wrist Ulnar Deviation  25 Degrees      Left Hand AROM   L Thumb MCP 0-60  60 Degrees    L Thumb IP 0-80  65 Degrees    L Thumb Radial ADduction/ABduction 0-55  58    L Thumb Palmar ADduction/ABduction 0-45  60    L Thumb Opposition to Index  --   Opposition to distal fold of 5th    L Index  MCP 0-90  70 Degrees    L Index PIP 0-100  90 Degrees    L Long  MCP 0-90  70 Degrees    L Long PIP 0-100  90 Degrees    L Ring  MCP 0-90  70 Degrees    L Ring PIP 0-100  90 Degrees    L Little  MCP 0-90  70 Degrees    L Little PIP 0-100  80 Degrees       FLuidotherapy done with AROM for wrist and digits in all planes -  To decrease stiffness  show increase AROM  Review with pt and wife HEP - hand out provided     Contrast  Scar massage  cica scar pad for night time with compression glove for edema  Tendon glides -AROM  Opposition to all digits   extention of digits  AROM 10 reps  3 x day  PROM for RD, UD , wrist flexion and extention over edge of table - but if bother shoulder - do prayer stretch for extention  AROM for wrist in all planes  10  reps  3 x day  For cubital tunnel symptoms - pt ed on not cradling hand on chest or propping up elbow  Pt and wife report understanding               OT Education - 04/05/18 1923    Education Details  findings of eval and HEP     Person(s) Educated  Patient;Spouse    Methods  Explanation;Demonstration;Handout    Comprehension  Returned demonstration;Verbalized understanding       OT Short Term Goals - 04/05/18 1952      OT SHORT TERM GOAL #1   Title  Pt to be ind in HEP to decrease numbness in ulnar N with cubital tunnel symptoms and  decrease edema and increase ROM in L wrist and digits to touch palm     Baseline  decrease in digits and wrist ROM - no knowledge of HEP     Time  3    Period  Weeks    Status  New    Target Date  04/26/18      OT SHORT TERM GOAL #2   Title  Pt L hand digits flexion and extention improve to [facility] to hold on to 1 inch cylinder objects without increase syptoms or pain     Baseline  Decrease AROM in digits flexion and 4th extention - tender over A1pulley of 4th     Time  3    Period  Weeks    Status  New    Target Date  04/26/18        OT Long Term Goals - 04/05/18 1956      OT LONG TERM GOAL #1   Title  L wrist AROM improve with more than 10-15 degrees to turn doorknob , use  in more than 50% bathing and dressing     Baseline  see flowshee-     Time  3    Period  Weeks    Status  New    Target Date  04/26/18      OT LONG TERM GOAL #2   Title  L grip and prehension strength increase to more than 50% compare to R hand to cut food, open jar, pick cup and carry more than 5 lbs without increase symptoms     Baseline  NT yet -     Time  6    Period  Weeks    Status  New    Target Date  05/17/18      OT LONG TERM GOAL #3   Title  L wrist strength increase to 4+/5 in all planes to push door open, turn doorknob , push on chair and wipe table     Baseline  no strengthening yet     Time  6    Period  Weeks    Status  New    Target  Date  05/17/18            Plan - 04/05/18 1925    Clinical Impression Statement  Pt present at OT eval 8 wks s/p L ORIF distal radius fx - pt show increase edema in digits and wrist - increase scar tissue on volar wrist - decrease AROM and strength in L hand digits and wrist in all planes - increase pain  - and pt show also some 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 with positive Tinel and tenderness - increase numbness at times- pt and wife report that pt cradle hand on chest , was in a sling and sleep in flex position or prop up elbows - report also some numbness in R medial N  - pt do report that he held some heavy objects  at times on hand since out of brace - reinforce with pt that he is 8 wks s/p   - and need to increase gradually his ROM and strength  - if uisng his hand pain shoulde not be increase more than 2/10 pain     Occupational performance deficits (Please refer to evaluation for details):  ADL's;IADL's;Play;Leisure    Rehab Potential  Good    OT Frequency  2x / week    OT Duration  6 weeks    OT Treatment/Interventions  Self-care/ADL training;Therapeutic exercise;Splinting;Patient/family education;Paraffin;Fluidtherapy;Contrast Bath;Manual Therapy;Passive range of motion;Scar mobilization    Plan  assess progress with HEP and upgrade as needed     Clinical Decision Making  Several treatment options, min-mod task modification necessary       Patient will benefit from skilled therapeutic intervention in order to improve the following deficits and impairments:  Impaired flexibility, Increased edema, Pain, Decreased scar mobility, Impaired sensation, Decreased strength, Impaired UE functional use, Decreased range of motion, Decreased coordination, Decreased activity tolerance  Visit Diagnosis: Stiffness of left hand, not elsewhere classified - Plan: Ot plan of care cert/re-cert  Stiffness of left wrist, not elsewhere classified - Plan: Ot plan of care  cert/re-cert  Muscle weakness (generalized) - Plan: Ot plan of care cert/re-cert  Scar tissue - Plan: Ot plan of care cert/re-cert  Pain in left wrist - Plan: Ot plan of care cert/re-cert    Problem List There are no active problems to display for this patient.   Rosalyn Gess OTR/L,CLT 04/05/2018, 8:03 PM  [facility] [address] [place], [address] Phone: [phone]   Fax:  [phone]  Name: Winfrey Chillemi. MRN: [id number] Date of Birth: [birth year]

## 2018-04-05 NOTE — Patient Instructions (Signed)
Contrast  Scar massage  cica scar pad for night time with compression glove for edema  Tendon glides -AROM  Opposition to all digits   extention of digits  AROM 10 reps  3 x day  PROM for RD, UD , wrist flexion and extention over edge of table - but if bother shoulder - do prayer stretch for extention  AROM for wrist in all planes  10 reps  3 x day  For cubital tunnel symptoms - pt ed on not cradling hand on chest or propping up elbow  Pt and wife report understanding

## 2018-04-10 ENCOUNTER — Ambulatory Visit: Payer: Medicare Other | Admitting: Occupational Therapy

## 2018-04-10 DIAGNOSIS — M25642 Stiffness of left hand, not elsewhere classified: Secondary | ICD-10-CM

## 2018-04-10 DIAGNOSIS — M25632 Stiffness of left wrist, not elsewhere classified: Secondary | ICD-10-CM

## 2018-04-10 DIAGNOSIS — M6281 Muscle weakness (generalized): Secondary | ICD-10-CM

## 2018-04-10 DIAGNOSIS — L905 Scar conditions and fibrosis of skin: Secondary | ICD-10-CM

## 2018-04-10 DIAGNOSIS — M25532 Pain in left wrist: Secondary | ICD-10-CM

## 2018-04-10 NOTE — Patient Instructions (Signed)
Same HEP - Add putty for rolling of digits extention - no flexion  Prior to tapping of digits   Pt to  focus on wrist extention  And RD, For ROM  And payer stretch correctly  And then add 16oz hammer for wrist for all planes  10 reps  but pain should be less than 2/1o

## 2018-04-10 NOTE — Therapy (Signed)
[facility] [address], [place], [address] Phone: [phone]   Fax:  [phone]  Occupational Therapy Treatment  Patient Details  Name: John Long. MRN: [id number] Date of Birth: 01/03/1941 Referring Provider (OT): Rudene Christians   Encounter Date: 04/10/2018  OT End of Session - 04/10/18 1657    Visit Number  2    Number of Visits  12    Date for OT Re-Evaluation  05/17/18    OT Start Time  1146    OT Stop Time  1236    OT Time Calculation (min)  50 min    Activity Tolerance  Patient tolerated treatment well    Behavior During Therapy  [facility] for tasks assessed/performed       Past Medical History:  Diagnosis Date  . Allergy   . Enlarged prostate   . Family history of colon cancer   . GERD (gastroesophageal reflux disease)   . Hypertension     Past Surgical History:  Procedure Laterality Date  . OPEN REDUCTION INTERNAL FIXATION (ORIF) DISTAL RADIAL FRACTURE Left 02/08/2018   Procedure: OPEN REDUCTION INTERNAL FIXATION (ORIF) DISTAL RADIAL FRACTURE;  Surgeon: Hessie Knows, MD;  Location: [facility] ORS;  Service: Orthopedics;  Laterality: Left;  . TONSILLECTOMY  1949    There were no vitals filed for this visit.  Subjective Assessment - 04/10/18 1156    Subjective   I have done some bowling - but that is with good hand -and then riding lawnmower - I like the scar pad how my scar looks and can tell my motion in fingers and wrist are better     Patient Stated Goals  Want to be able to use my hand -I am very active in bowling , drive RV and travel , do some woodwork , fix things , help in the house with some tasks     Currently in Pain?  No/denies         [facility] OT Assessment - 04/10/18 0001      AROM   Left Forearm Pronation  80 Degrees    Left Forearm Supination  85 Degrees    Left Wrist Extension  37 Degrees    Left Wrist Flexion  50 Degrees    Left Wrist Radial Deviation  15 Degrees    Left  Wrist Ulnar Deviation  30 Degrees      Left Hand AROM   L Thumb Opposition to Index  --   Opposition to 2nd fold of 5th    L Index  MCP 0-90  70 Degrees    L Index PIP 0-100  90 Degrees    L Long  MCP 0-90  75 Degrees    L Long PIP 0-100  95 Degrees    L Ring  MCP 0-90  80 Degrees    L Ring PIP 0-100  95 Degrees    L Little  MCP 0-90  80 Degrees    L Little PIP 0-100  100 Degrees      Measure AROM for L wrist and digits - at [facility] - see flow sheet for progress   Great progress pt to focus ont wrist extention and RD   cont to have some edema in hand - and over MC's mostly and composite flexion  Pt to cont contrast prior to ROM           OT Treatments/Exercises (OP) - 04/10/18 0001      LUE  Fluidotherapy   Number Minutes Fluidotherapy  8 Minutes    LUE Fluidotherapy Location  Hand;Wrist    Comments  AROM for wrist in all planes at [facility] to increase ROM and decrease stiffness       scar assess- cont to be adhere and limiting his flexion and extention   kinesiotape done at end of session -to do during daytime   ed on taping - 20% pull parallel to scar and 3 100% pull across     Add putty for rolling of digits extention  Prior to tapping of digits   and tendon glides to cont with - focus composite to bottom of palm   PROM for RD, UD , wrist flexion and extention over edge of table - but if bother shoulder - do prayer stretch for extention - needed min A   10 reps  3 x day     Pt to  focus on wrist extention  And RD,- was able to do extention of edge of table this date  For ROM  And payer stretch correctly  And then add 16oz hammer for wrist for all planes  10 reps- some pain at pronation - to not go end range   but pain should be less than 2/10  OT Education - 04/10/18 1656    Education Details  progress and review HEP and add new ones     Person(s) Educated  Patient    Methods  Explanation;Demonstration;Handout    Comprehension  Returned demonstration;Verbalized  understanding       OT Short Term Goals - 04/05/18 1952      OT SHORT TERM GOAL #1   Title  Pt to be ind in HEP to decrease numbness in ulnar N with cubital tunnel symptoms and  decrease edema and increase ROM in L wrist and digits to touch palm     Baseline  decrease in digits and wrist ROM - no knowledge of HEP     Time  3    Period  Weeks    Status  New    Target Date  04/26/18      OT SHORT TERM GOAL #2   Title  Pt L hand digits flexion and extention improve to [facility] to hold on to 1 inch cylinder objects without increase syptoms or pain     Baseline  Decrease AROM in digits flexion and 4th extention - tender over A1pulley of 4th     Time  3    Period  Weeks    Status  New    Target Date  04/26/18        OT Long Term Goals - 04/05/18 1956      OT LONG TERM GOAL #1   Title  L wrist AROM improve with more than 10-15 degrees to turn doorknob , use in more than 50% bathing and dressing     Baseline  see flowshee-     Time  3    Period  Weeks    Status  New    Target Date  04/26/18      OT LONG TERM GOAL #2   Title  L grip and prehension strength increase to more than 50% compare to R hand to cut food, open jar, pick cup and carry more than 5 lbs without increase symptoms     Baseline  NT yet -     Time  6    Period  Weeks    Status  New  Target Date  05/17/18      OT LONG TERM GOAL #3   Title  L wrist strength increase to 4+/5 in all planes to push door open, turn doorknob , push on chair and wipe table     Baseline  no strengthening yet     Time  6    Period  Weeks    Status  New    Target Date  05/17/18            Plan - 04/10/18 1658    Clinical Impression Statement  Pt made some good progress since last time in ROM in digits , wrist AROM - but least progress in wrist extention - initated this date strengthening for wrist in all planes - had some discomfort for Pronation with weight - pt to keep pull under 2/10 - and some adhesions at scar still -  kinesiotape add for daytime use     Occupational performance deficits (Please refer to evaluation for details):  ADL's;IADL's;Play;Leisure    Rehab Potential  Good    OT Frequency  2x / week    OT Duration  6 weeks    OT Treatment/Interventions  Self-care/ADL training;Therapeutic exercise;Splinting;Patient/family education;Paraffin;Fluidtherapy;Contrast Bath;Manual Therapy;Passive range of motion;Scar mobilization    Plan  assess progress with HEP and upgrade as needed     Clinical Decision Making  Several treatment options, min-mod task modification necessary    Consulted and Agree with Plan of Care  Patient       Patient will benefit from skilled therapeutic intervention in order to improve the following deficits and impairments:  Impaired flexibility, Increased edema, Pain, Decreased scar mobility, Impaired sensation, Decreased strength, Impaired UE functional use, Decreased range of motion, Decreased coordination, Decreased activity tolerance  Visit Diagnosis: Stiffness of left hand, not elsewhere classified  Stiffness of left wrist, not elsewhere classified  Muscle weakness (generalized)  Scar tissue  Pain in left wrist    Problem List There are no active problems to display for this patient.   Rosalyn Gess OTR/L,CLT 04/10/2018, 5:04 PM  [facility] [address] [place], [address] Phone: [phone]   Fax:  [phone]  Name: John [facility]. MRN: [id number] Date of Birth: June 13, 1941

## 2018-04-12 ENCOUNTER — Ambulatory Visit: Payer: Medicare Other | Admitting: Occupational Therapy

## 2018-04-12 DIAGNOSIS — M25642 Stiffness of left hand, not elsewhere classified: Secondary | ICD-10-CM | POA: Diagnosis not present

## 2018-04-12 DIAGNOSIS — M25532 Pain in left wrist: Secondary | ICD-10-CM

## 2018-04-12 DIAGNOSIS — M6281 Muscle weakness (generalized): Secondary | ICD-10-CM

## 2018-04-12 DIAGNOSIS — M25632 Stiffness of left wrist, not elsewhere classified: Secondary | ICD-10-CM

## 2018-04-12 DIAGNOSIS — L905 Scar conditions and fibrosis of skin: Secondary | ICD-10-CM

## 2018-04-12 NOTE — Patient Instructions (Signed)
Pt to increase sets for weight to 2 sets   2 x day  And if no pain  Increase to 3 sets   1 x day Sunday   Putty started for grip ,and lat grip - 12 reps  1 x day   and increase Sun 2 sets   if no pain

## 2018-04-12 NOTE — Therapy (Signed)
[place] [facility] [address] [place], [address] Phone: [phone]   Fax:  [phone]  Occupational Therapy Treatment  Patient Details  Name: John Long. MRN: [id number] Date of Birth: [birth year] Referring Provider (OT): Rudene Christians   Encounter Date: 04/12/2018  OT End of Session - 04/12/18 1246    Visit Number  3    Number of Visits  12    Date for OT Re-Evaluation  05/17/18    OT Start Time  1017    OT Stop Time  1105    OT Time Calculation (min)  48 min    Activity Tolerance  Patient tolerated treatment well    Behavior During Therapy  [facility] for tasks assessed/performed       Past Medical History:  Diagnosis Date  . Allergy   . Enlarged prostate   . Family history of colon cancer   . GERD (gastroesophageal reflux disease)   . Hypertension     Past Surgical History:  Procedure Laterality Date  . OPEN REDUCTION INTERNAL FIXATION (ORIF) DISTAL RADIAL FRACTURE Left 02/08/2018   Procedure: OPEN REDUCTION INTERNAL FIXATION (ORIF) DISTAL RADIAL FRACTURE;  Surgeon: Hessie Knows, MD;  Location: [facility] ORS;  Service: Orthopedics;  Laterality: Left;  . TONSILLECTOMY  1949    There were no vitals filed for this visit.  Subjective Assessment - 04/12/18 1100    Subjective   I can tell that I am making progress- my fingers are more straigth -still some swelling and wrist I did my exerices and weight - no increase pain - the tape did cause some redness last night     Patient Stated Goals  Want to be able to use my hand -I am very active in bowling , drive RV and travel , do some woodwork , fix things , help in the house with some tasks     Currently in Pain?  No/denies         [facility] OT Assessment - 04/12/18 0001      AROM   Left Wrist Extension  42 Degrees    Left Wrist Flexion  52 Degrees      Strength   Right Hand Grip (lbs)  40    Right Hand Lateral Pinch  16 lbs    Right Hand 3 Point Pinch  14  lbs    Left Hand Grip (lbs)  27    Left Hand Lateral Pinch  13 lbs       assess progress in AROM - digits  And grip and prehension assess this date           OT Treatments/Exercises (OP) - 04/12/18 0001      LUE Fluidotherapy   Number Minutes Fluidotherapy  8 Minutes    LUE Fluidotherapy Location  Hand;Wrist    Comments  AROM for wrist in all  planes at [facility] to increase ROM       scar massage and mobs done by OT and graston tool nr 2 for sweeping and brushing - pt to cont taping if no increase redness - and take turns with Cica scar pad night time    scar assess- cont to be adhere and limiting his flexion and extention    Pt to do gripping and lat grip in putty 10 reps  2 x day  do  putty for rolling of digits extention  inbetween  Can increase to 2 sets Sunday if not increase pain  Prior to tapping of digits   and tendon glides to cont with - focus composite to bottom of palm   PROM for RD, UD , wrist flexion and extention over edge of table -to cont with  10 reps 3 x day   This date done CPM on BTE for flexion of wrist 200 sec  And repeat extention and flexion 200 sec     review and cont with 16oz hammer for wrist for all planes  10 reps- pain should be less than 2/10 Can increase to 2 sets today And Sunday if no pain - 3 sets but one time day       OT Education - 04/12/18 1245    Education Details  progress and review HEP and add new ones     Person(s) Educated  Patient    Methods  Explanation;Demonstration;Handout    Comprehension  Returned demonstration;Verbalized understanding       OT Short Term Goals - 04/05/18 1952      OT SHORT TERM GOAL #1   Title  Pt to be ind in HEP to decrease numbness in ulnar N with cubital tunnel symptoms and  decrease edema and increase ROM in L wrist and digits to touch palm     Baseline  decrease in digits and wrist ROM - no knowledge of HEP     Time  3    Period  Weeks    Status  New    Target Date  04/26/18       OT SHORT TERM GOAL #2   Title  Pt L hand digits flexion and extention improve to [facility] to hold on to 1 inch cylinder objects without increase syptoms or pain     Baseline  Decrease AROM in digits flexion and 4th extention - tender over A1pulley of 4th     Time  3    Period  Weeks    Status  New    Target Date  04/26/18        OT Long Term Goals - 04/05/18 1956      OT LONG TERM GOAL #1   Title  L wrist AROM improve with more than 10-15 degrees to turn doorknob , use in more than 50% bathing and dressing     Baseline  see flowshee-     Time  3    Period  Weeks    Status  New    Target Date  04/26/18      OT LONG TERM GOAL #2   Title  L grip and prehension strength increase to more than 50% compare to R hand to cut food, open jar, pick cup and carry more than 5 lbs without increase symptoms     Baseline  NT yet -     Time  6    Period  Weeks    Status  New    Target Date  05/17/18      OT LONG TERM GOAL #3   Title  L wrist strength increase to 4+/5 in all planes to push door open, turn doorknob , push on chair and wipe table     Baseline  no strengthening yet     Time  6    Period  Weeks    Status  New    Target Date  05/17/18            Plan - 04/12/18 1246    Clinical Impression Statement  Pt cont to make progress  in wrist AROM and digits - show increase extention and flexion -2nd digit still decrease mostly - grip decrease but in bilateral hands - report since summer increase pain in R 2nd digit - pt scar improving - and  increase use -     Occupational performance deficits (Please refer to evaluation for details):  ADL's;IADL's;Play;Leisure    Rehab Potential  Good    OT Frequency  2x / week    OT Duration  6 weeks    OT Treatment/Interventions  Self-care/ADL training;Therapeutic exercise;Splinting;Patient/family education;Paraffin;Fluidtherapy;Contrast Bath;Manual Therapy;Passive range of motion;Scar mobilization    Plan  assess progress and upgrade as  needed     Clinical Decision Making  Several treatment options, min-mod task modification necessary    OT Home Exercise Plan  see pt instruction     Consulted and Agree with Plan of Care  Patient       Patient will benefit from skilled therapeutic intervention in order to improve the following deficits and impairments:     Visit Diagnosis: Stiffness of left hand, not elsewhere classified  Stiffness of left wrist, not elsewhere classified  Muscle weakness (generalized)  Scar tissue  Pain in left wrist    Problem List There are no active problems to display for this patient.   Rosalyn Gess OTR/L,CLT 04/12/2018, 12:50 PM  [place] [facility] [address], [place], [address] Phone: [phone]   Fax:  [phone]  Name: Barrie Wale. MRN: [id number] Date of Birth: January 31, 1941

## 2018-04-17 ENCOUNTER — Ambulatory Visit: Payer: Medicare Other | Admitting: Occupational Therapy

## 2018-04-17 DIAGNOSIS — M25642 Stiffness of left hand, not elsewhere classified: Secondary | ICD-10-CM

## 2018-04-17 DIAGNOSIS — M25632 Stiffness of left wrist, not elsewhere classified: Secondary | ICD-10-CM

## 2018-04-17 DIAGNOSIS — M6281 Muscle weakness (generalized): Secondary | ICD-10-CM

## 2018-04-17 DIAGNOSIS — M25532 Pain in left wrist: Secondary | ICD-10-CM

## 2018-04-17 DIAGNOSIS — L905 Scar conditions and fibrosis of skin: Secondary | ICD-10-CM

## 2018-04-17 NOTE — Patient Instructions (Signed)
Pt to cont with same HEP- but increase use of 1 lbs for wrist - to 3 sets  And not over do at home - with to much weight on hand and wrist

## 2018-04-17 NOTE — Therapy (Signed)
[place] [facility] [address] [place], [address] Phone: [phone]   Fax:  [phone]  Occupational Therapy Treatment  Patient Details  Name: John Long. MRN: [id number] Date of Birth: 1940-09-10 Referring Provider (OT): Rudene Christians   Encounter Date: 04/17/2018  OT End of Session - 04/17/18 1428    Visit Number  4    Number of Visits  12    Date for OT Re-Evaluation  05/17/18    OT Start Time  1101    OT Stop Time  1147    OT Time Calculation (min)  46 min    Activity Tolerance  Patient tolerated treatment well    Behavior During Therapy  [facility] for tasks assessed/performed       Past Medical History:  Diagnosis Date  . Allergy   . Enlarged prostate   . Family history of colon cancer   . GERD (gastroesophageal reflux disease)   . Hypertension     Past Surgical History:  Procedure Laterality Date  . OPEN REDUCTION INTERNAL FIXATION (ORIF) DISTAL RADIAL FRACTURE Left 02/08/2018   Procedure: OPEN REDUCTION INTERNAL FIXATION (ORIF) DISTAL RADIAL FRACTURE;  Surgeon: Hessie Knows, MD;  Location: [facility] ORS;  Service: Orthopedics;  Laterality: Left;  . TONSILLECTOMY  1949    There were no vitals filed for this visit.  Subjective Assessment - 04/17/18 1102    Subjective   I maybe over done things on Sunday and yesterday - I went bowling , and use 8 lbs slash hammer and done some other stuff around the house - then I payed for it later- so did not do my exercises as much - more the hot and cold - hand was swollen too - the putty is good     Patient Stated Goals  Want to be able to use my hand -I am very active in bowling , drive RV and travel , do some woodwork , fix things , help in the house with some tasks     Currently in Pain?  No/denies         [facility] OT Assessment - 04/17/18 0001      AROM   Left Wrist Extension  50 Degrees    Left Wrist Flexion  55 Degrees    Left Wrist Radial Deviation  15  Degrees    Left Wrist Ulnar Deviation  30 Degrees       Measurements taken - see flow sheet- improving still - but need some reminder not to lift to heavy object - did 8 lbs slash hammer over weekend  And then had increase pain and edema in hand and wrist   Pt report increase use at home         OT Treatments/Exercises (OP) - 04/17/18 0001      LUE Fluidotherapy   Number Minutes Fluidotherapy  8 Minutes    LUE Fluidotherapy Location  Hand;Wrist    Comments  AROM for wrist in all planes with AROM       scar massage done and mobs - use graston tool nr 2 for sweeping and brushing over volar forearm and wrist -and xtractor on distal wrist where adhere more  Pt to cont cica scar pad for night time and taping during day     Pt to do gripping and lat grip in putty 10 reps  2 x day  do  putty for rolling of digits extention  inbetween  2 sets not  increase pain    Cont with PROM for RD, UD , wrist flexion and extention over edge of table  10 reps 3 x day   CPM on BTE for extention of wrist 200 sec  And repeat extention and flexion 200 sec  1 lbs for wrist flexion and extention 10 reps  and RD, UD 10 reps   1 lbs    review and cont with 16oz hammer for wrist for all planes 10 reps- pain should be less than 2/10 2-3 sets              OT Education - 04/17/18 1428    Education Details  progress and review HEP -and not to use to much weight     Person(s) Educated  Patient    Methods  Explanation;Demonstration;Handout    Comprehension  Returned demonstration;Verbalized understanding       OT Short Term Goals - 04/05/18 1952      OT SHORT TERM GOAL #1   Title  Pt to be ind in HEP to decrease numbness in ulnar N with cubital tunnel symptoms and  decrease edema and increase ROM in L wrist and digits to touch palm     Baseline  decrease in digits and wrist ROM - no knowledge of HEP     Time  3    Period  Weeks    Status  New    Target Date   04/26/18      OT SHORT TERM GOAL #2   Title  Pt L hand digits flexion and extention improve to [facility] to hold on to 1 inch cylinder objects without increase syptoms or pain     Baseline  Decrease AROM in digits flexion and 4th extention - tender over A1pulley of 4th     Time  3    Period  Weeks    Status  New    Target Date  04/26/18        OT Long Term Goals - 04/05/18 1956      OT LONG TERM GOAL #1   Title  L wrist AROM improve with more than 10-15 degrees to turn doorknob , use in more than 50% bathing and dressing     Baseline  see flowshee-     Time  3    Period  Weeks    Status  New    Target Date  04/26/18      OT LONG TERM GOAL #2   Title  L grip and prehension strength increase to more than 50% compare to R hand to cut food, open jar, pick cup and carry more than 5 lbs without increase symptoms     Baseline  NT yet -     Time  6    Period  Weeks    Status  New    Target Date  05/17/18      OT LONG TERM GOAL #3   Title  L wrist strength increase to 4+/5 in all planes to push door open, turn doorknob , push on chair and wipe table     Baseline  no strengthening yet     Time  6    Period  Weeks    Status  New    Target Date  05/17/18            Plan - 04/17/18 1429    Clinical Impression Statement  Pt cont to show improvement in AROM at wrist - scar still adhere distally -  pt to focus on wrist flexion and extention ROM - and increase strength- but not over do it at home in act - cont to have some edema over hands causing some stiffness in digits    Occupational performance deficits (Please refer to evaluation for details):  ADL's;IADL's;Play;Leisure    Rehab Potential  Good    OT Frequency  2x / week    OT Duration  4 weeks    OT Treatment/Interventions  Self-care/ADL training;Therapeutic exercise;Splinting;Patient/family education;Paraffin;Fluidtherapy;Contrast Bath;Manual Therapy;Passive range of motion;Scar mobilization    Plan  assess progress and upgrade  as needed     Clinical Decision Making  Several treatment options, min-mod task modification necessary    OT Home Exercise Plan  see pt instruction     Consulted and Agree with Plan of Care  Patient       Patient will benefit from skilled therapeutic intervention in order to improve the following deficits and impairments:  Impaired flexibility, Increased edema, Pain, Decreased scar mobility, Impaired sensation, Decreased strength, Impaired UE functional use, Decreased range of motion, Decreased coordination, Decreased activity tolerance  Visit Diagnosis: Stiffness of left hand, not elsewhere classified  Stiffness of left wrist, not elsewhere classified  Muscle weakness (generalized)  Scar tissue  Pain in left wrist    Problem List There are no active problems to display for this patient.   Rosalyn Gess OTR/L,CLT 04/17/2018, 2:32 PM  [place] [facility] [address] [place], [address] Phone: [phone]   Fax:  [phone]  Name: Karlo Goeden. MRN: [id number] Date of Birth: 07-26-1940

## 2018-04-19 ENCOUNTER — Ambulatory Visit: Payer: Medicare Other | Admitting: Occupational Therapy

## 2018-04-19 DIAGNOSIS — M25642 Stiffness of left hand, not elsewhere classified: Secondary | ICD-10-CM

## 2018-04-19 DIAGNOSIS — M25632 Stiffness of left wrist, not elsewhere classified: Secondary | ICD-10-CM

## 2018-04-19 DIAGNOSIS — L905 Scar conditions and fibrosis of skin: Secondary | ICD-10-CM

## 2018-04-19 DIAGNOSIS — M6281 Muscle weakness (generalized): Secondary | ICD-10-CM

## 2018-04-19 DIAGNOSIS — M25532 Pain in left wrist: Secondary | ICD-10-CM

## 2018-04-19 NOTE — Therapy (Signed)
[place] [facility] [address] [place], [address] Phone: [phone]   Fax:  [phone]  Occupational Therapy Treatment  Patient Details  Name: John Long. MRN: [id number] Date of Birth: [birth year] Referring Provider (OT): Rudene Christians   Encounter Date: 04/19/2018  OT End of Session - 04/19/18 1840    Visit Number  5    Number of Visits  12    Date for OT Re-Evaluation  05/17/18    OT Start Time  1440    OT Stop Time  1522    OT Time Calculation (min)  42 min    Activity Tolerance  Patient tolerated treatment well    Behavior During Therapy  [facility] for tasks assessed/performed       Past Medical History:  Diagnosis Date  . Allergy   . Enlarged prostate   . Family history of colon cancer   . GERD (gastroesophageal reflux disease)   . Hypertension     Past Surgical History:  Procedure Laterality Date  . OPEN REDUCTION INTERNAL FIXATION (ORIF) DISTAL RADIAL FRACTURE Left 02/08/2018   Procedure: OPEN REDUCTION INTERNAL FIXATION (ORIF) DISTAL RADIAL FRACTURE;  Surgeon: Hessie Knows, MD;  Location: [facility] ORS;  Service: Orthopedics;  Laterality: Left;  . TONSILLECTOMY  1949    There were no vitals filed for this visit.  Subjective Assessment - 04/19/18 1837    Subjective   Seen Dr Rudene Christians yesterday - was very happy with my progress with making fist and wrist motion - for me to cont with you - did okay with the 2 lbs weight but it is amazing how much heavier it feels than the  1 lbs     Patient Stated Goals  Want to be able to use my hand -I am very active in bowling , drive RV and travel , do some woodwork , fix things , help in the house with some tasks     Currently in Pain?  No/denies         [facility] OT Assessment - 04/19/18 0001      AROM   Left Wrist Extension  56 Degrees    Left Wrist Flexion  55 Degrees      Strength   Right Hand Grip (lbs)  50    Right Hand Lateral Pinch  16 lbs    Left Hand  Grip (lbs)  35    Left Hand Lateral Pinch  14 lbs       assess AROM for wrist and digits - and grip /prehension strength - see flowsheet         OT Treatments/Exercises (OP) - 04/19/18 0001      LUE Fluidotherapy   Number Minutes Fluidotherapy  10 Minutes    LUE Fluidotherapy Location  Hand;Wrist    Comments  AROM prior to soft tissue mobs and ROM -        scar massage done and mobs - use graston tool nr 2 for sweeping and brushing over volar forearm and wrist -and xtractor on distal wrist where adhere more   soft tissue massage to CP and MC spreads  And webspace  Ed on use kinesiotape stat at distal scar - to use only 3-4 hrs a day - not for long  Pt to cont cica scar pad for night time    gripping and lat grip in putty 10 reps  2 x day doputty for rolling of digits extention inbetween  Can increase  to 2-3 sets - not increase pain   Table slides 20 reps  done and add to HEP    CPM on BTE  extention and flexion 200 sec 2 lbs  For RD, UD , sup and pronation  10 reps  increase  2 sets  And work to increase to 3 sets in ffew days  But can keep 1 lbs for wrist extention and flexion           OT Education - 04/19/18 1840    Education Details  table slides for wrist extention -and increase sets     Person(s) Educated  Patient    Methods  Explanation;Demonstration;Handout    Comprehension  Returned demonstration;Verbalized understanding       OT Short Term Goals - 04/05/18 1952      OT SHORT TERM GOAL #1   Title  Pt to be ind in HEP to decrease numbness in ulnar N with cubital tunnel symptoms and  decrease edema and increase ROM in L wrist and digits to touch palm     Baseline  decrease in digits and wrist ROM - no knowledge of HEP     Time  3    Period  Weeks    Status  New    Target Date  04/26/18      OT SHORT TERM GOAL #2   Title  Pt L hand digits flexion and extention improve to [facility] to hold on to 1 inch cylinder objects without  increase syptoms or pain     Baseline  Decrease AROM in digits flexion and 4th extention - tender over A1pulley of 4th     Time  3    Period  Weeks    Status  New    Target Date  04/26/18        OT Long Term Goals - 04/05/18 1956      OT LONG TERM GOAL #1   Title  L wrist AROM improve with more than 10-15 degrees to turn doorknob , use in more than 50% bathing and dressing     Baseline  see flowshee-     Time  3    Period  Weeks    Status  New    Target Date  04/26/18      OT LONG TERM GOAL #2   Title  L grip and prehension strength increase to more than 50% compare to R hand to cut food, open jar, pick cup and carry more than 5 lbs without increase symptoms     Baseline  NT yet -     Time  6    Period  Weeks    Status  New    Target Date  05/17/18      OT LONG TERM GOAL #3   Title  L wrist strength increase to 4+/5 in all planes to push door open, turn doorknob , push on chair and wipe table     Baseline  no strengthening yet     Time  6    Period  Weeks    Status  New    Target Date  05/17/18            Plan - 04/19/18 1840    Clinical Impression Statement  Pt cont to make progress in wrist AROM and digits- cont to fight some edema and stiffness in hand- and scar adhere distally - change kinesiotape to star distally and to do for only about 4 hrs  Occupational performance deficits (Please refer to evaluation for details):  ADL's;IADL's;Play;Leisure    Rehab Potential  Good    OT Frequency  2x / week    OT Duration  4 weeks    OT Treatment/Interventions  Self-care/ADL training;Therapeutic exercise;Splinting;Patient/family education;Paraffin;Fluidtherapy;Contrast Bath;Manual Therapy;Passive range of motion;Scar mobilization    Plan  asses progress with 2 lbs and putty - if can increase putty resistance      Clinical Decision Making  Several treatment options, min-mod task modification necessary    OT Home Exercise Plan  see pt instruction     Consulted and  Agree with Plan of Care  Patient       Patient will benefit from skilled therapeutic intervention in order to improve the following deficits and impairments:     Visit Diagnosis: Stiffness of left hand, not elsewhere classified  Stiffness of left wrist, not elsewhere classified  Muscle weakness (generalized)  Scar tissue  Pain in left wrist    Problem List There are no active problems to display for this patient.   Rosalyn Gess OTR/L,CLT 04/19/2018, 6:43 PM  [place] [facility] [address] [place], [address] Phone: [phone]   Fax:  [phone]  Name: John Long. MRN: [id number] Date of Birth: [birth year]

## 2018-04-19 NOTE — Patient Instructions (Addendum)
Table sildes add for wrist extention - 20 reps  2 lbs for wrist in all planes except wrist flexion and extentoin - cont 1 lbs  Can work on 2- 3 sets   And cont with putty - same - 2-3 sets   Change kinesiotape -to star distal scar -and only about 4 hrs day

## 2018-04-24 ENCOUNTER — Ambulatory Visit: Payer: Medicare Other | Attending: Orthopedic Surgery | Admitting: Occupational Therapy

## 2018-04-24 DIAGNOSIS — M25532 Pain in left wrist: Secondary | ICD-10-CM

## 2018-04-24 DIAGNOSIS — M25632 Stiffness of left wrist, not elsewhere classified: Secondary | ICD-10-CM | POA: Diagnosis present

## 2018-04-24 DIAGNOSIS — L905 Scar conditions and fibrosis of skin: Secondary | ICD-10-CM | POA: Diagnosis present

## 2018-04-24 DIAGNOSIS — M25642 Stiffness of left hand, not elsewhere classified: Secondary | ICD-10-CM

## 2018-04-24 DIAGNOSIS — M6281 Muscle weakness (generalized): Secondary | ICD-10-CM | POA: Diagnosis present

## 2018-04-24 NOTE — Therapy (Signed)
[place] [facility] [address] [place], [address] Phone: [phone]   Fax:  [phone]  Occupational Therapy Treatment  Patient Details  Name: John Long. MRN: [id number] Date of Birth: Mar 08, 1941 Referring Provider (OT): Rudene Christians   Encounter Date: 04/24/2018  OT End of Session - 04/24/18 1407    Visit Number  6    Number of Visits  12    Date for OT Re-Evaluation  05/17/18    OT Start Time  1050    OT Stop Time  1131    OT Time Calculation (min)  41 min    Activity Tolerance  Patient tolerated treatment well    Behavior During Therapy  [facility] for tasks assessed/performed       Past Medical History:  Diagnosis Date  . Allergy   . Enlarged prostate   . Family history of colon cancer   . GERD (gastroesophageal reflux disease)   . Hypertension     Past Surgical History:  Procedure Laterality Date  . OPEN REDUCTION INTERNAL FIXATION (ORIF) DISTAL RADIAL FRACTURE Left 02/08/2018   Procedure: OPEN REDUCTION INTERNAL FIXATION (ORIF) DISTAL RADIAL FRACTURE;  Surgeon: Hessie Knows, MD;  Location: [facility] ORS;  Service: Orthopedics;  Laterality: Left;  . TONSILLECTOMY  1949    There were no vitals filed for this visit.  Subjective Assessment - 04/24/18 1403    Subjective   I have done the 2 lbs weight - some strain to do up and down of wrist and scar still stuck - done the scar massage and tape some - trying to use it more - putty doing okay     Patient Stated Goals  Want to be able to use my hand -I am very active in bowling , drive RV and travel , do some woodwork , fix things , help in the house with some tasks     Currently in Pain?  No/denies         [facility] OT Assessment - 04/24/18 0001      AROM   Left Wrist Extension  54 Degrees    Left Wrist Flexion  64 Degrees      Strength   Right Hand Grip (lbs)  50    Right Hand Lateral Pinch  16 lbs    Right Hand 3 Point Pinch  14 lbs    Left Hand  Grip (lbs)  40    Left Hand Lateral Pinch  15 lbs      Left Hand AROM   L Index  MCP 0-90  80 Degrees    L Index PIP 0-100  90 Degrees    L Long  MCP 0-90  80 Degrees    L Long PIP 0-100  95 Degrees    L Ring  MCP 0-90  84 Degrees    L Ring PIP 0-100  100 Degrees    L Little  MCP 0-90  88 Degrees    L Little PIP 0-100  100 Degrees       assess AROM for digits and wrist - some progress - wrist extention stiff  and composite fist- but able to make full fist and touch palm after tendon glides  Progress in grip and prehension strength- see flowsheet          OT Treatments/Exercises (OP) - 04/24/18 0001      LUE Paraffin   Number Minutes Paraffin  8 Minutes    LUE Paraffin Location  Wrist;Hand    Comments  prior to soft tissue mobs to increase wrist extention and decrease scar tissue       done 2-3 x wrist extention stretch 1 min  Scar massage and focus on distal end with coban - and provided for pt  And vibration done this date and xtractor   done Graston tool on volar hand and forearm - using sweeping with nr 2 tool  CPM for wrist extention done - 200 sec   x 2  Pt to cont with same HEP as last time        OT Education - 04/24/18 1407    Education Details  table slides for wrist extention -scar massage on distal adhesion      Person(s) Educated  Patient    Methods  Explanation;Demonstration;Handout    Comprehension  Returned demonstration;Verbalized understanding       OT Short Term Goals - 04/05/18 1952      OT SHORT TERM GOAL #1   Title  Pt to be ind in HEP to decrease numbness in ulnar N with cubital tunnel symptoms and  decrease edema and increase ROM in L wrist and digits to touch palm     Baseline  decrease in digits and wrist ROM - no knowledge of HEP     Time  3    Period  Weeks    Status  New    Target Date  04/26/18      OT SHORT TERM GOAL #2   Title  Pt L hand digits flexion and extention improve to [facility] to hold on to 1 inch cylinder objects  without increase syptoms or pain     Baseline  Decrease AROM in digits flexion and 4th extention - tender over A1pulley of 4th     Time  3    Period  Weeks    Status  New    Target Date  04/26/18        OT Long Term Goals - 04/05/18 1956      OT LONG TERM GOAL #1   Title  L wrist AROM improve with more than 10-15 degrees to turn doorknob , use in more than 50% bathing and dressing     Baseline  see flowshee-     Time  3    Period  Weeks    Status  New    Target Date  04/26/18      OT LONG TERM GOAL #2   Title  L grip and prehension strength increase to more than 50% compare to R hand to cut food, open jar, pick cup and carry more than 5 lbs without increase symptoms     Baseline  NT yet -     Time  6    Period  Weeks    Status  New    Target Date  05/17/18      OT LONG TERM GOAL #3   Title  L wrist strength increase to 4+/5 in all planes to push door open, turn doorknob , push on chair and wipe table     Baseline  no strengthening yet     Time  6    Period  Weeks    Status  New    Target Date  05/17/18            Plan - 04/24/18 1408    Clinical Impression Statement  Pt cont to make progress in wrist flexion - extention still stiff -  but increase after doing CPM for 2 x 200 sec- doing good with 2 lbs weight - scar improving still adhere at distal end - pt to focus on it - still stiffness in digits fleixon but able to do full fist after tendon glides - to do PRWHE next session     Occupational performance deficits (Please refer to evaluation for details):  ADL's;IADL's;Play;Leisure    Rehab Potential  Good    OT Frequency  2x / week    OT Duration  4 weeks    Plan  upgrade HEP for putty and strength as needed - and do PRWHE for function     Clinical Decision Making  Several treatment options, min-mod task modification necessary    OT Home Exercise Plan  see pt instruction     Consulted and Agree with Plan of Care  Patient       Patient will benefit from skilled  therapeutic intervention in order to improve the following deficits and impairments:  Impaired flexibility, Increased edema, Pain, Decreased scar mobility, Impaired sensation, Decreased strength, Impaired UE functional use, Decreased range of motion, Decreased coordination, Decreased activity tolerance  Visit Diagnosis: Stiffness of left hand, not elsewhere classified  Stiffness of left wrist, not elsewhere classified  Muscle weakness (generalized)  Scar tissue  Pain in left wrist    Problem List There are no active problems to display for this patient.   Rosalyn Gess OTR/L,CLT 04/24/2018, 2:11 PM  [place] [facility] [address] [place], [address] Phone: [phone]   Fax:  [phone]  Name: John Long. MRN: [id number] Date of Birth: 21-Nov-1940

## 2018-04-24 NOTE — Patient Instructions (Signed)
Pt to focus on distal scar adhesion - and scar massage  focus on stretches for wrist extention  And 2 lbs weight - cont 3 sets of 10  And putty teal - 3 sets for grip , lat and 3 point grip   10 -12 reps  2 x day  And increase functional use

## 2018-04-27 ENCOUNTER — Ambulatory Visit: Payer: Medicare Other | Admitting: Occupational Therapy

## 2018-04-27 DIAGNOSIS — M25532 Pain in left wrist: Secondary | ICD-10-CM

## 2018-04-27 DIAGNOSIS — M25642 Stiffness of left hand, not elsewhere classified: Secondary | ICD-10-CM | POA: Diagnosis not present

## 2018-04-27 DIAGNOSIS — M6281 Muscle weakness (generalized): Secondary | ICD-10-CM

## 2018-04-27 DIAGNOSIS — L905 Scar conditions and fibrosis of skin: Secondary | ICD-10-CM

## 2018-04-27 DIAGNOSIS — M25632 Stiffness of left wrist, not elsewhere classified: Secondary | ICD-10-CM

## 2018-04-27 NOTE — Patient Instructions (Signed)
Pt to cont for 2 wks  PROM / stretches for wrist flexion and extention   Cont scar massage and mobs and using tape Cont 2 lbs weight for wrist flexion and extention  Can do 3 lbs for sup/pro. RD, UD and do some bicep and tricep exercises  Up to med green putty for grip , lat and 4 point grip but decrease to 1 set of 10-15 reps  and can increase gradually over next 2 wks Increase functional use  And elbow pad made for L elbow to use during day for propping up over cubital tunnel and sleeping at night time on inside of elbow

## 2018-04-27 NOTE — Therapy (Signed)
[place] [facility] [address] [place], [address] Phone: [phone]   Fax:  [phone]  Occupational Therapy Treatment  Patient Details  Name: John Long. MRN: [id number] Date of Birth: 16-Jun-1941 Referring Provider (OT): Rudene Christians   Encounter Date: 04/27/2018  OT End of Session - 04/27/18 1527    Number of Visits  12    Date for OT Re-Evaluation  05/17/18    OT Start Time  1003    OT Stop Time  1050    OT Time Calculation (min)  47 min    Activity Tolerance  Patient tolerated treatment well    Behavior During Therapy  [facility] for tasks assessed/performed       Past Medical History:  Diagnosis Date  . Allergy   . Enlarged prostate   . Family history of colon cancer   . GERD (gastroesophageal reflux disease)   . Hypertension     Past Surgical History:  Procedure Laterality Date  . OPEN REDUCTION INTERNAL FIXATION (ORIF) DISTAL RADIAL FRACTURE Left 02/08/2018   Procedure: OPEN REDUCTION INTERNAL FIXATION (ORIF) DISTAL RADIAL FRACTURE;  Surgeon: Hessie Knows, MD;  Location: [facility] ORS;  Service: Orthopedics;  Laterality: Left;  . TONSILLECTOMY  1949    There were no vitals filed for this visit.  Subjective Assessment - 04/27/18 1522    Subjective   I am using it more - around the house , no pain really - both index fingers just tight - but getting better - just feel like my strength in both hands not as good as it use to be - and then still getting numbness in bilateral  hands if I sit with my elbow prop up or reading - doing my putty and 2 lbs weight     Patient Stated Goals  Want to be able to use my hand -I am very active in bowling , drive RV and travel , do some woodwork , fix things , help in the house with some tasks     Currently in Pain?  No/denies         [facility] OT Assessment - 04/27/18 0001      Strength   Right Hand Grip (lbs)  58    Right Hand Lateral Pinch  16 lbs    Right Hand 3  Point Pinch  17 lbs    Left Hand Grip (lbs)  45    Left Hand Lateral Pinch  15 lbs        AROM for wrist and grip /prehension strength - assess - pt cont to show progress   Pt to focus on wrist flexion and extention the next 2 wks at home  Scar massage -and using kinesiotape and cica scar pad for night time   Done PRWHE - simulate act - pain only 1/50 and function 2/50   could carry and pick up 10 lbs with L hand and wrist with no issues or pain        OT Treatments/Exercises (OP) - 04/27/18 0001      LUE Paraffin   Number Minutes Paraffin  8 Minutes    LUE Paraffin Location  Wrist;Hand    Comments  while in heat - wrist flexion and extention stretches 2 min x 2 each        Cont 2 lbs weight for wrist flexion and extention  Can do 3 lbs for sup/pro. RD, UD and do some bicep and tricep exercises  Up to med green putty for grip , lat and 4 point grip but decrease to 1 set of 10-15 reps  and can increase gradually over next 2 wks Increase functional use  And elbow pad made for L elbow to use during day for propping up over cubital tunnel and sleeping   still tender over Cubital tunnel on L and positive Tinel         OT Education - 04/27/18 1526    Education Details  progress and homeprogram for the next 2 wks     Person(s) Educated  Patient    Methods  Explanation;Demonstration;Handout    Comprehension  Returned demonstration;Verbalized understanding       OT Short Term Goals - 04/27/18 1532      OT SHORT TERM GOAL #1   Title  Pt to be ind in HEP to decrease numbness in ulnar N with cubital tunnel symptoms and  decrease edema and increase ROM in L wrist and digits to touch palm     Baseline  progress in all - still some cubital tunnel symptoms     Time  2    Period  Weeks    Status  On-going    Target Date  05/11/18      OT SHORT TERM GOAL #2   Title  Pt L hand digits flexion and extention improve to [facility] to hold on to 1 inch cylinder objects without increase  syptoms or pain     Status  Achieved        OT Long Term Goals - 04/27/18 1536      OT LONG TERM GOAL #1   Title  L wrist AROM improve with more than 10-15 degrees to turn doorknob , use in more than 50% bathing and dressing     Status  Achieved      OT LONG TERM GOAL #2   Title  L grip and prehension strength increase to more than 50% compare to R hand to cut food, open jar, pick cup and carry more than 5 lbs without increase symptoms     Status  Achieved      OT LONG TERM GOAL #3   Title  L wrist strength increase to 4+/5 in all planes to push door open, turn doorknob , push on chair and wipe table     Status  Achieved            Plan - 04/27/18 1530    Clinical Impression Statement  Pt made great progress in the last 3-4 wks - progress in edema, ROM at digits and wrist  in L , decrease pain and increase functional use and strenght- pt to cont with ROM and scar massage for 2 more wks - focus on flexion and extention - cont to strengtheniing and also provided elbow pad for some cubital tunnel symptoms at times on L - pt is tender and positive Tinel but better than he was at eval      Occupational performance deficits (Please refer to evaluation for details):  ADL's;IADL's;Play;Leisure    Rehab Potential  Good    OT Frequency  Biweekly    OT Duration  2 weeks    OT Treatment/Interventions  Self-care/ADL training;Therapeutic exercise;Splinting;Patient/family education;Paraffin;Fluidtherapy;Contrast Bath;Manual Therapy;Passive range of motion;Scar mobilization    Plan  assess progress after doing HEP for 2wks and possible discharge     Clinical Decision Making  Limited treatment options, no task modification necessary    OT Home Exercise  Plan  see pt instruction     Consulted and Agree with Plan of Care  Patient       Patient will benefit from skilled therapeutic intervention in order to improve the following deficits and impairments:  Impaired flexibility, Increased edema,  Pain, Decreased scar mobility, Impaired sensation, Decreased strength, Impaired UE functional use, Decreased range of motion, Decreased coordination, Decreased activity tolerance  Visit Diagnosis: Stiffness of left hand, not elsewhere classified  Stiffness of left wrist, not elsewhere classified  Muscle weakness (generalized)  Scar tissue  Pain in left wrist    Problem List There are no active problems to display for this patient.   Rosalyn Gess OTR/L,CLT 04/27/2018, 3:37 PM  [place] [facility] [address], [place], [address] Phone: [phone]   Fax:  [phone]  Name: Reichen Hutzler. MRN: [id number] Date of Birth: [birth year]

## 2018-05-11 ENCOUNTER — Ambulatory Visit: Payer: Medicare Other | Admitting: Occupational Therapy

## 2018-05-11 DIAGNOSIS — L905 Scar conditions and fibrosis of skin: Secondary | ICD-10-CM

## 2018-05-11 DIAGNOSIS — M25532 Pain in left wrist: Secondary | ICD-10-CM

## 2018-05-11 DIAGNOSIS — M25642 Stiffness of left hand, not elsewhere classified: Secondary | ICD-10-CM | POA: Diagnosis not present

## 2018-05-11 DIAGNOSIS — M25632 Stiffness of left wrist, not elsewhere classified: Secondary | ICD-10-CM

## 2018-05-11 DIAGNOSIS — M6281 Muscle weakness (generalized): Secondary | ICD-10-CM

## 2018-05-11 NOTE — Therapy (Signed)
[place] [facility] [address] [place], [address] Phone: [phone]   Fax:  [phone]  Occupational Therapy Treatment  Patient Details  Name: Daune Colgate. MRN: [id number] Date of Birth: 1941-03-10 Referring Provider (OT): Rudene Christians   Encounter Date: 05/11/2018  OT End of Session - 05/11/18 1034    Visit Number  7    Number of Visits  7    Date for OT Re-Evaluation  05/11/18    OT Start Time  0950    OT Stop Time  1015    OT Time Calculation (min)  25 min    Activity Tolerance  Patient tolerated treatment well    Behavior During Therapy  [facility] for tasks assessed/performed       Past Medical History:  Diagnosis Date  . Allergy   . Enlarged prostate   . Family history of colon cancer   . GERD (gastroesophageal reflux disease)   . Hypertension     Past Surgical History:  Procedure Laterality Date  . OPEN REDUCTION INTERNAL FIXATION (ORIF) DISTAL RADIAL FRACTURE Left 02/08/2018   Procedure: OPEN REDUCTION INTERNAL FIXATION (ORIF) DISTAL RADIAL FRACTURE;  Surgeon: Hessie Knows, MD;  Location: [facility] ORS;  Service: Orthopedics;  Laterality: Left;  . TONSILLECTOMY  1949    There were no vitals filed for this visit.  Subjective Assessment - 05/11/18 1019    Subjective   I been using my hand more - very happy with it - resting bowling ball on it , doing things around the house - no daily pain - sometimes feel something on side of wrist - but all good     Patient Stated Goals  Want to be able to use my hand -I am very active in bowling , drive RV and travel , do some woodwork , fix things , help in the house with some tasks     Currently in Pain?  No/denies         [facility] OT Assessment - 05/11/18 0001      AROM   Left Forearm Pronation  90 Degrees    Left Forearm Supination  90 Degrees    Left Wrist Extension  54 Degrees    Left Wrist Flexion  64 Degrees    Left Wrist Radial Deviation  15 Degrees     Left Wrist Ulnar Deviation  32 Degrees      Strength   Right Hand Grip (lbs)  58    Right Hand Lateral Pinch  17 lbs    Right Hand 3 Point Pinch  15 lbs    Left Hand Grip (lbs)  48    Left Hand Lateral Pinch  17 lbs       Assess pt AROM in L wrist and grip /prehension strength - see flow sheet Pt strength in wrist in all planes 5/5 Pt to cont to increase functional strength  And wrist flexion and extention stretches - still decrease compare to R wrist   And putty green - cont gripping , and add this date some functional pulling and twisting   Done PRWHE with pt - pain and function now 0/50  Pt discharge from OT services                   OT Education - 05/11/18 1034    Education Details  progress and discharge instructions     Person(s) Educated  Patient    Methods  Explanation;Demonstration;Handout  Comprehension  Returned demonstration;Verbalized understanding       OT Short Term Goals - 05/11/18 1037      OT SHORT TERM GOAL #1   Title  Pt to be ind in HEP to decrease numbness in ulnar N with cubital tunnel symptoms and  decrease edema and increase ROM in L wrist and digits to touch palm     Baseline  Pt still have some cubital tunnel symptoms on the L - but know what to do at home - ? cervical involvement - pt has some sensory symptoms bilateral    Status  Achieved      OT SHORT TERM GOAL #2   Title  Pt L hand digits flexion and extention improve to [facility] to hold on to 1 inch cylinder objects without increase syptoms or pain     Status  Achieved        OT Long Term Goals - 05/11/18 1043      OT LONG TERM GOAL #1   Title  L wrist AROM improve with more than 10-15 degrees to turn doorknob , use in more than 50% bathing and dressing     Status  Achieved      OT LONG TERM GOAL #2   Title  L grip and prehension strength increase to more than 50% compare to R hand to cut food, open jar, pick cup and carry more than 5 lbs without increase symptoms      Status  Achieved      OT LONG TERM GOAL #3   Title  L wrist strength increase to 4+/5 in all planes to push door open, turn doorknob , push on chair and wipe table     Status  Achieved            Plan - 05/11/18 1035    Clinical Impression Statement  Pt made great progress in L wrist and digits  edema , pain, AROM , strength - met all goals and able to performing all ADL's and ADL's - progress pain 2/50 and function 0/50 - pt discharge from OT services     OT Treatment/Interventions  Self-care/ADL training;Therapeutic exercise;Splinting;Patient/family education;Paraffin;Fluidtherapy;Contrast Bath;Manual Therapy;Passive range of motion;Scar mobilization    Plan  discharge with HEP     OT Home Exercise Plan  see pt instruction     Consulted and Agree with Plan of Care  Patient       Patient will benefit from skilled therapeutic intervention in order to improve the following deficits and impairments:     Visit Diagnosis: Stiffness of left hand, not elsewhere classified  Stiffness of left wrist, not elsewhere classified  Muscle weakness (generalized)  Pain in left wrist  Scar tissue    Problem List There are no active problems to display for this patient.   Rosalyn Gess OTR/L,CLT 05/11/2018, 10:43 AM  [place] [facility] [address] [place], [address] Phone: [phone]   Fax:  [phone]  Name: Erubiel Manasco. MRN: [id number] Date of Birth: 18-Jul-1940

## 2018-05-11 NOTE — Patient Instructions (Signed)
Cont at home for another 4 wks in wrist flexion and extention stretches  Cont green putty for gripping and add pulling , twisting for functional strengthening  And cont to increase use of L hand and wrist

## 2018-07-31 ENCOUNTER — Other Ambulatory Visit: Payer: Self-pay

## 2018-07-31 ENCOUNTER — Ambulatory Visit: Payer: Medicare Other | Admitting: General Surgery

## 2018-07-31 ENCOUNTER — Encounter: Payer: Self-pay | Admitting: General Surgery

## 2018-07-31 VITALS — BP 156/83 | HR 66 | Temp 97.9°F | Resp 16 | Ht 67.0 in | Wt 170.6 lb

## 2018-07-31 DIAGNOSIS — K4091 Unilateral inguinal hernia, without obstruction or gangrene, recurrent: Secondary | ICD-10-CM | POA: Diagnosis not present

## 2018-07-31 NOTE — Patient Instructions (Addendum)
Recommend fiber supplement at least one week prior to surgery to avoid constipation.  Inguinal Hernia, Adult An inguinal hernia is when fat or your intestines push through a weak spot in a muscle where your leg meets your lower belly (groin). This causes a rounded lump (bulge). This kind of hernia could also be:  In your scrotum, if you are male.  In folds of skin around your vagina, if you are male. There are three types of inguinal hernias. These include:  Hernias that can be pushed back into the belly (are reducible). This type rarely causes pain.  Hernias that cannot be pushed back into the belly (are incarcerated).  Hernias that cannot be pushed back into the belly and lose their blood supply (are strangulated). This type needs emergency surgery. If you do not have symptoms, you may not need treatment. If you have symptoms or a large hernia, you may need surgery. Follow these instructions at home: Lifestyle  Do these things if told by your doctor so you do not have trouble pooping (constipation): ? Drink enough fluid to keep your pee (urine) pale yellow. ? Eat foods that have a lot of fiber. These include fresh fruits and vegetables, whole grains, and beans. ? Limit foods that are high in fat and processed sugars. These include foods that are fried or sweet. ? Take medicine for trouble pooping.  Avoid lifting heavy objects.  Avoid standing for long amounts of time.  Do not use any products that contain nicotine or tobacco. These include cigarettes and e-cigarettes. If you need help quitting, ask your doctor.  Stay at a healthy weight. General instructions  You may try to push your hernia in by very gently pressing on it when you are lying down. Do not try to force the bulge back in if it will not push in easily.  Watch your hernia for any changes in shape, size, or color. Tell your doctor if you see any changes.  Take over-the-counter and prescription medicines only as  told by your doctor.  Keep all follow-up visits as told by your doctor. This is important. Contact a doctor if:  You have a fever.  You have new symptoms.  Your symptoms get worse. Get help right away if:  The area where your leg meets your lower belly has: ? Pain that gets worse suddenly. ? A bulge that gets bigger suddenly, and it does not get smaller after that. ? A bulge that turns red or purple. ? A bulge that is painful when you touch it.  You are a man, and your scrotum: ? Suddenly feels painful. ? Suddenly changes in size.  You cannot push the hernia in by very gently pressing on it when you are lying down. Do not try to force the bulge back in if it will not push in easily.  You feel sick to your stomach (nauseous), and that feeling does not go away.  You throw up (vomit), and that keeps happening.  You have a fast heartbeat.  You cannot poop (have a bowel movement) or pass gas. These symptoms may be an emergency. Do not wait to see if the symptoms will go away. Get medical help right away. Call your local emergency services (911 in the [place] Summary  An inguinal hernia is when fat or your intestines push through a weak spot in a muscle where your leg meets your lower belly (groin). This causes a rounded lump (bulge).  If you do not have symptoms,  you may not need treatment. If you have symptoms or a large hernia, you may need surgery.  Avoid lifting heavy objects. Also avoid standing for long amounts of time.  Do not try to force the bulge back in if it will not push in easily. This information is not intended to replace advice given to you by your health care provider. Make sure you discuss any questions you have with your health care provider. Document Released: 07/07/2006 Document Revised: 07/08/2017 Document Reviewed: 03/08/2017 Elsevier Interactive Patient Education  2019 [employer].

## 2018-07-31 NOTE — Progress Notes (Signed)
Patient ID: John Ser., male   DOB: 01/25/1941, 78 y.o.   MRN: [id number]  Chief Complaint  Patient presents with  . Inguinal Hernia    left     HPI John Long. is a 78 y.o. male.  Here for evaluation of a left inguinal hernia recommend by Vance Peper. States he noticed the bulge last summer. He denies any pain. He did notice some pain in left groin after coughing last week while riding in the car. He was seen by Urology and he seen a physician at [facility] surgery who repaired his bilateral hernias.  Scan was in [place]. He is trying to get all his care in [place] now. He retired from [employer].  HPI  Past Medical History:  Diagnosis Date  . Allergy   . Enlarged prostate   . Family history of colon cancer   . GERD (gastroesophageal reflux disease)   . Hypertension     Past Surgical History:  Procedure Laterality Date  . HERNIA REPAIR Bilateral 07/24/2017  . OPEN REDUCTION INTERNAL FIXATION (ORIF) DISTAL RADIAL FRACTURE Left 02/08/2018   Procedure: OPEN REDUCTION INTERNAL FIXATION (ORIF) DISTAL RADIAL FRACTURE;  Surgeon: Hessie Knows, MD;  Location: [facility] ORS;  Service: Orthopedics;  Laterality: Left;  . TONSILLECTOMY  1949    Family History  Problem Relation Age of Onset  . Colon cancer Father 41  . Dementia Mother   . Esophageal cancer Neg Hx   . Stomach cancer Neg Hx   . Rectal cancer Neg Hx     Social History Social History   Tobacco Use  . Smoking status: Former Smoker    Packs/day: 1.00    Years: 20.00    Pack years: 20.00    Types: Cigarettes    Last attempt to quit: 06/20/1997    Years since quitting: 21.1  . Smokeless tobacco: Never Used  Substance Use Topics  . Alcohol use: Yes    Alcohol/week: 14.0 standard drinks    Types: 14 Glasses of wine per week    Comment: 1-2/day  . Drug use: No    Allergies  Allergen Reactions  . Vasotec [Enalapril Maleate] Cough    Current Outpatient Medications  Medication Sig Dispense  Refill  . amLODipine (NORVASC) 2.5 MG tablet Take 2.5 mg by mouth daily.    . cholecalciferol (VITAMIN D3) 25 MCG (1000 UT) tablet Take 1,000 Units by mouth daily.    . finasteride (PROSCAR) 5 MG tablet Take 5 mg by mouth daily.    Marland Kitchen ibuprofen (ADVIL,MOTRIN) 200 MG tablet Take 400 mg by mouth every 6 (six) hours as needed.     Marland Kitchen ketoconazole (NIZORAL) 2 % shampoo Apply 1 application topically 2 (two) times a week.    . Multiple Vitamins-Minerals (ICAPS AREDS 2 PO) Take by mouth daily.    . sildenafil (VIAGRA) 50 MG tablet Take 50 mg by mouth daily as needed for erectile dysfunction.    Marland Kitchen telmisartan (MICARDIS) 80 MG tablet Take 80 mg by mouth daily.    Marland Kitchen triamcinolone cream (KENALOG) 0.1 % Apply 1 application topically daily.     No current facility-administered medications for this visit.     Review of Systems Review of Systems  Constitutional: Negative.   Respiratory: Negative.   Cardiovascular: Negative.     Blood pressure (!) 156/83, pulse 66, temperature 97.9 F (36.6 C), temperature source Skin, resp. rate 16, height 5\' 7"  (1.702 m), weight 170 lb 9.6 oz (77.4 kg), SpO2 98 %.  Physical Exam Physical Exam Constitutional:      Appearance: He is well-developed.  HENT:     Mouth/Throat:     Pharynx: No oropharyngeal exudate.  Eyes:     General: No scleral icterus.    Conjunctiva/sclera: Conjunctivae normal.  Neck:     Musculoskeletal: Neck supple.  Cardiovascular:     Rate and Rhythm: Normal rate and regular rhythm.     Heart sounds: Normal heart sounds.  Pulmonary:     Effort: Pulmonary effort is normal.     Breath sounds: Normal breath sounds.  Abdominal:     General: Bowel sounds are normal.     Palpations: Abdomen is soft.     Hernia: A hernia is present. Hernia is present in the left inguinal area.     Comments: reducible left inguinal hernia  Genitourinary:    Penis: Normal.      Scrotum/Testes: Normal.    Skin:    General: Skin is warm and dry.   Neurological:     Mental Status: He is alert and oriented to person, place, and time.  Psychiatric:        Behavior: Behavior normal.     Data Reviewed Operative report from his original repair completed in [facility] February 2019 has been requested.  Laboratory studies dated February 08, 2018 showed a normal CBC with a hemoglobin of 14.2 and an MCV of 87.9.  Platelet count of 290,000.  White blood cell count 8800 with normal differential. Basic metabolic panel at the same date was notable for minimally depressed serum chloride at 97, blood sugar of 111.  Normal renal function with an estimated GFR greater than 60.  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  3 mm on pathology.  Recommendation at that time for repeat colonoscopy in 5 years.  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    Candidate for follow-up colonoscopy based on 2014 exam.   Plan    Indications for repair reviewed.  Plan for anterior approach.  Activity restrictions post surgery reviewed including a 10 pound weight limit and no driving until pain-free. Recommend fiber supplement at least one week prior to surgery to avoid constipation.  Hernia precautions and incarceration were discussed with the patient. If they develop symptoms of an incarcerated hernia, they were encouraged to seek prompt medical attention.  I have recommended repair of the hernia using mesh on an outpatient basis in the near future. The risk of infection was reviewed. The role of prosthetic mesh to minimize the risk of recurrence was reviewed.  He may have developed a sliding hernia, and I would postpone repeat colonoscopy either in [facility] or locally until after his hernia repair.  HPI, assessment, plan and physical exam has been scribed under the direction and in the presence of Robert Bellow, MD. Karie Fetch, RN  I have completed the exam and reviewed the  above documentation for accuracy and completeness.  I agree with the above.  Haematologist has been used and any errors in dictation or transcription are unintentional.  Hervey Ard, M.D., F.A.C.S.  John Long 08/01/2018, 10:52 AM

## 2018-08-01 ENCOUNTER — Telehealth: Payer: Self-pay

## 2018-08-01 DIAGNOSIS — K4091 Unilateral inguinal hernia, without obstruction or gangrene, recurrent: Secondary | ICD-10-CM | POA: Insufficient documentation

## 2018-08-01 NOTE — Telephone Encounter (Signed)
Call to patient to see about scheduling surgery. The patient is scheduled for surgery at [facility] with Dr Bary Castilla on 08/22/18. He will pre admit at the hospital a week prior and we will call him with that appointment date and time. The patient is aware of date and instructions.   Request for surgical records has been faxed to [facility] at [phone].

## 2018-08-02 NOTE — Telephone Encounter (Signed)
Spoke with the patient and notified him of pre admit date and time. He will pre admit at the hospital in the [place] on 08/14/18 at 8:00 am. He is aware of date, time, and instructions.

## 2018-08-14 ENCOUNTER — Encounter
Admission: RE | Admit: 2018-08-14 | Discharge: 2018-08-14 | Disposition: A | Discharge status: Home / Self Care | Payer: Medicare Other | Source: Ambulatory Visit | Attending: General Surgery | Admitting: General Surgery

## 2018-08-14 ENCOUNTER — Other Ambulatory Visit: Payer: Self-pay

## 2018-08-14 DIAGNOSIS — R9431 Abnormal electrocardiogram [ECG] [EKG]: Secondary | ICD-10-CM | POA: Diagnosis not present

## 2018-08-14 DIAGNOSIS — I10 Essential (primary) hypertension: Secondary | ICD-10-CM | POA: Insufficient documentation

## 2018-08-14 DIAGNOSIS — Z01818 Encounter for other preprocedural examination: Secondary | ICD-10-CM | POA: Diagnosis not present

## 2018-08-14 DIAGNOSIS — I445 Left posterior fascicular block: Secondary | ICD-10-CM | POA: Insufficient documentation

## 2018-08-14 LAB — CBC
HEMATOCRIT: 44 % (ref 39.0–52.0)
HEMOGLOBIN: 14.4 g/dL (ref 13.0–17.0)
MCH: 29.1 pg (ref 26.0–34.0)
MCHC: 32.7 g/dL (ref 30.0–36.0)
MCV: 89.1 fL (ref 80.0–100.0)
Platelets: 293 10*3/uL (ref 150–400)
RBC: 4.94 MIL/uL (ref 4.22–5.81)
RDW: 13.4 % (ref 11.5–15.5)
WBC: 8.4 10*3/uL (ref 4.0–10.5)
nRBC: 0 % (ref 0.0–0.2)

## 2018-08-14 LAB — BASIC METABOLIC PANEL
Anion gap: 10 (ref 5–15)
BUN: 14 mg/dL (ref 8–23)
CHLORIDE: 101 mmol/L (ref 98–111)
CO2: 25 mmol/L (ref 22–32)
Calcium: 9.1 mg/dL (ref 8.9–10.3)
Creatinine, Ser: 0.75 mg/dL (ref 0.61–1.24)
GFR calc Af Amer: >60 mL/min (ref >60)
GFR calc non Af Amer: >60 mL/min (ref >60)
GLUCOSE: 96 mg/dL (ref 70–99)
POTASSIUM: 3.9 mmol/L (ref 3.5–5.1)
Sodium: 136 mmol/L (ref 135–145)

## 2018-08-14 LAB — PSA: Prostatic Specific Antigen: 5.84 ng/mL — ABNORMAL HIGH (ref 0.00–4.00)

## 2018-08-14 NOTE — Pre-Procedure Instructions (Signed)
EKG/REQUEST FOR CLEARANCE CALLED AND FAXED TO PCP DR K LITTLE AND SPOKE WITH MANDY. ALSO CALLED AND FAXED TO CAROL[facility] AT DR BYRNETT. NOTIFIED PATIENT

## 2018-08-14 NOTE — Patient Instructions (Signed)
Your procedure is scheduled on: Wednesday 08/22/18.  Report to DAY SURGERY DEPARTMENT LOCATED ON 2ND FLOOR MEDICAL MALL ENTRANCE. To find out your arrival time please call [phone] between 1PM - 3PM on Tuesday 08/21/18.    Remember: Instructions that are not followed completely may result in serious medical risk, up to and including death, or upon the discretion of your surgeon and anesthesiologist your surgery may need to be rescheduled.      _X__ 1. Do not eat food after midnight the night before your procedure.                 No gum chewing or hard candies. You may drink clear liquids up to 2 hours                 before you are scheduled to arrive for your surgery- DO NOT drink clear                 liquids within 2 hours of the start of your surgery.                 Clear Liquids include:  water, apple juice without pulp, clear carbohydrate                 drink such as Clearfast or Gatorade, Black Coffee or Tea (Do not add                 milk or creamer to coffee or tea).  __X__2.  On the morning of surgery brush your teeth with toothpaste and water, you may rinse your mouth with mouthwash if you wish.  Do not swallow any toothpaste or mouthwash.      _X__ 3.  No Alcohol for 24 hours before or after surgery.   __X__4.  Notify your doctor if there is any change in your medical condition      (cold/upper respiratory infection, fever, infections).      Do not wear jewelry, make-up, hairpins, clips or nail polish. Do not wear lotions, powders, or perfumes.  Do not shave 48 hours prior to surgery. Men may shave face and neck. Do not bring valuables to the hospital.     [facility] is not responsible for any belongings or valuables.   Contacts, dentures/partials or body piercings may not be worn into surgery. Bring a case for your contacts, glasses or hearing aids, a denture cup will be supplied.    Patients discharged the day of surgery will not be allowed to drive  home.    Please read over the following fact sheets that you were given:   MRSA Information   __X__ Take these medicines the morning of surgery with A SIP OF WATER:     1. amLODipine (NORVASC) 5 MG tablet  2. finasteride (PROSCAR) 5 MG tablet  3. acetaminophen (TYLENOL) 500 MG tablet if needed      __X__ Use CHG Soap as directed    __X__ Stop Blood Thinners Coumadin/Plavix/Xarelto/Pleta/Pradaxa/Eliquis/Effient/Aspirin     __X__ Stop Anti-inflammatories 7 days before surgery such as Advil, Ibuprofen, Motrin, BC or Goodies Powder, Naprosyn, Naproxen, Aleve, Aspirin, Meloxicam. The last day you may take Ibuprofen is tomorrow 08/15/18. May take Tylenol if needed for pain or discomfort.     __X__ Do not begin taking any new herbal supplements before your procedure.

## 2018-08-15 ENCOUNTER — Telehealth: Payer: Self-pay | Admitting: *Deleted

## 2018-08-15 ENCOUNTER — Encounter: Payer: Self-pay | Admitting: *Deleted

## 2018-08-15 NOTE — Telephone Encounter (Signed)
Per Caryl,Lyn, we received a medical clearance request via fax on this patient from the Pre-admission Testing Department yesterday. This is due to changes in EKG from 2019. Request per Dr. Ronelle Nigh.   I did call and speak with Olin Hauser at Dr. Eddie Dibbles office this morning who states that they have faxed clearance back to Pre-admission Testing this morning.   I have also requested that this be faxed to our office as well.   Surgery scheduled for 08-22-18 with Dr. Bary Castilla.

## 2018-08-15 NOTE — Pre-Procedure Instructions (Signed)
CLEARANCE ON CHART 

## 2018-08-15 NOTE — Progress Notes (Signed)
We did receive medical clearance from Dr. Eddie Dibbles office.   Caryl Pina in [place] confirms receipt of this as well.   Patient notified of the above and verbalizes understanding.   Will proceed with surgery as scheduled.

## 2018-08-20 ENCOUNTER — Telehealth: Payer: Self-pay

## 2018-08-20 NOTE — Telephone Encounter (Signed)
Patient called and states that he has a cold with coughing and wants to reschedule his surgery with Dr Bary Castilla scheduled for 08/22/18. He is now rescheduled with Dr Bary Castilla to 08/27/18. He is aware to call the Friday before to get his arrival time. The OR has been notified.

## 2018-08-24 ENCOUNTER — Telehealth: Payer: Self-pay

## 2018-08-24 NOTE — Telephone Encounter (Signed)
Patient is still sick and wants to reschedule his surgery. He is now rescheduled to 09/05/18 at [facility] with Dr Bary Castilla. He is aware to call the day before to get his arrival time. The patient is aware of date and instructions.

## 2018-09-04 MED ORDER — CEFAZOLIN SODIUM-DEXTROSE 2-4 GM/100ML-% IV SOLN
2.0000 g | INTRAVENOUS | Status: AC
  Administered 2018-09-05: 2 g via INTRAVENOUS

## 2018-09-05 ENCOUNTER — Ambulatory Visit
Admission: RE | Admit: 2018-09-05 | Discharge: 2018-09-05 | Disposition: A | Discharge status: Home / Self Care | Payer: Medicare Other | Attending: General Surgery | Admitting: General Surgery

## 2018-09-05 ENCOUNTER — Encounter
Admission: RE | Disposition: A | Discharge status: Home / Self Care | Payer: Self-pay | Source: Home / Self Care | Attending: General Surgery

## 2018-09-05 ENCOUNTER — Ambulatory Visit: Payer: Medicare Other | Admitting: Anesthesiology

## 2018-09-05 ENCOUNTER — Other Ambulatory Visit: Payer: Self-pay

## 2018-09-05 DIAGNOSIS — N400 Enlarged prostate without lower urinary tract symptoms: Secondary | ICD-10-CM | POA: Insufficient documentation

## 2018-09-05 DIAGNOSIS — Z800 Family history of malignant neoplasm of digestive organs: Secondary | ICD-10-CM | POA: Diagnosis not present

## 2018-09-05 DIAGNOSIS — Z87891 Personal history of nicotine dependence: Secondary | ICD-10-CM | POA: Insufficient documentation

## 2018-09-05 DIAGNOSIS — K4091 Unilateral inguinal hernia, without obstruction or gangrene, recurrent: Secondary | ICD-10-CM | POA: Diagnosis present

## 2018-09-05 DIAGNOSIS — Z79899 Other long term (current) drug therapy: Secondary | ICD-10-CM | POA: Insufficient documentation

## 2018-09-05 DIAGNOSIS — I10 Essential (primary) hypertension: Secondary | ICD-10-CM | POA: Insufficient documentation

## 2018-09-05 HISTORY — PX: INGUINAL HERNIA REPAIR: SHX194

## 2018-09-05 SURGERY — REPAIR, HERNIA, INGUINAL, ADULT
Anesthesia: General | Laterality: Left

## 2018-09-05 MED ORDER — LIDOCAINE HCL (PF) 2 % IJ SOLN
INTRAMUSCULAR | Status: AC
  Filled 2018-09-05: qty 10

## 2018-09-05 MED ORDER — PROPOFOL 10 MG/ML IV BOLUS
INTRAVENOUS | Status: DC | PRN
  Administered 2018-09-05: 140 mg via INTRAVENOUS
  Administered 2018-09-05: 40 mg via INTRAVENOUS

## 2018-09-05 MED ORDER — ONDANSETRON HCL 4 MG/2ML IJ SOLN
INTRAMUSCULAR | Status: DC | PRN
  Administered 2018-09-05: 4 mg via INTRAVENOUS

## 2018-09-05 MED ORDER — KETOROLAC TROMETHAMINE 30 MG/ML IJ SOLN
INTRAMUSCULAR | Status: DC | PRN
  Administered 2018-09-05: 30 mg via INTRAVENOUS

## 2018-09-05 MED ORDER — HYDROMORPHONE HCL 1 MG/ML IJ SOLN: 0.2500 mg | INTRAMUSCULAR | Status: DC | PRN

## 2018-09-05 MED ORDER — FAMOTIDINE 20 MG PO TABS
ORAL_TABLET | ORAL | Status: AC
  Administered 2018-09-05: 20 mg via ORAL
  Filled 2018-09-05: qty 1

## 2018-09-05 MED ORDER — CEFAZOLIN SODIUM-DEXTROSE 2-4 GM/100ML-% IV SOLN
INTRAVENOUS | Status: AC
  Filled 2018-09-05: qty 100

## 2018-09-05 MED ORDER — SUCCINYLCHOLINE CHLORIDE 20 MG/ML IJ SOLN
INTRAMUSCULAR | Status: AC
  Filled 2018-09-05: qty 1

## 2018-09-05 MED ORDER — LIDOCAINE HCL (CARDIAC) PF 100 MG/5ML IV SOSY
PREFILLED_SYRINGE | INTRAVENOUS | Status: DC | PRN
  Administered 2018-09-05: 80 mg via INTRAVENOUS

## 2018-09-05 MED ORDER — PHENYLEPHRINE HCL 10 MG/ML IJ SOLN
INTRAMUSCULAR | Status: AC
  Filled 2018-09-05: qty 1

## 2018-09-05 MED ORDER — DEXAMETHASONE SODIUM PHOSPHATE 4 MG/ML IJ SOLN
INTRAMUSCULAR | Status: AC
  Filled 2018-09-05: qty 1

## 2018-09-05 MED ORDER — BUPIVACAINE-EPINEPHRINE (PF) 0.5% -1:200000 IJ SOLN
INTRAMUSCULAR | Status: AC
  Filled 2018-09-05: qty 30

## 2018-09-05 MED ORDER — HYDROCODONE-ACETAMINOPHEN 5-325 MG PO TABS: 1.0000 | ORAL_TABLET | ORAL | 0 refills | Status: DC | PRN

## 2018-09-05 MED ORDER — PROPOFOL 10 MG/ML IV BOLUS
INTRAVENOUS | Status: AC
  Filled 2018-09-05: qty 20

## 2018-09-05 MED ORDER — ACETAMINOPHEN 10 MG/ML IV SOLN
INTRAVENOUS | Status: DC | PRN
  Administered 2018-09-05: 1000 mg via INTRAVENOUS

## 2018-09-05 MED ORDER — FENTANYL CITRATE (PF) 100 MCG/2ML IJ SOLN
INTRAMUSCULAR | Status: DC | PRN
  Administered 2018-09-05 (×2): 25 ug via INTRAVENOUS

## 2018-09-05 MED ORDER — FAMOTIDINE 20 MG PO TABS
20.0000 mg | ORAL_TABLET | Freq: Once | ORAL | Status: AC
  Administered 2018-09-05: 20 mg via ORAL

## 2018-09-05 MED ORDER — GABAPENTIN 300 MG PO CAPS
ORAL_CAPSULE | ORAL | Status: AC
  Administered 2018-09-05: 300 mg via ORAL
  Filled 2018-09-05: qty 1

## 2018-09-05 MED ORDER — GLYCOPYRROLATE 0.2 MG/ML IJ SOLN
INTRAMUSCULAR | Status: AC
  Filled 2018-09-05: qty 1

## 2018-09-05 MED ORDER — ONDANSETRON HCL 4 MG/2ML IJ SOLN
INTRAMUSCULAR | Status: AC
  Filled 2018-09-05: qty 2

## 2018-09-05 MED ORDER — LACTATED RINGERS IV SOLN
INTRAVENOUS | Status: DC
  Administered 2018-09-05: 07:00:00 via INTRAVENOUS

## 2018-09-05 MED ORDER — GLYCOPYRROLATE 0.2 MG/ML IJ SOLN
INTRAMUSCULAR | Status: DC | PRN
  Administered 2018-09-05: 0.2 mg via INTRAVENOUS

## 2018-09-05 MED ORDER — DEXAMETHASONE SODIUM PHOSPHATE 10 MG/ML IJ SOLN
INTRAMUSCULAR | Status: DC | PRN
  Administered 2018-09-05: 5 mg via INTRAVENOUS

## 2018-09-05 MED ORDER — GABAPENTIN 300 MG PO CAPS
300.0000 mg | ORAL_CAPSULE | ORAL | Status: AC
  Administered 2018-09-05: 300 mg via ORAL

## 2018-09-05 MED ORDER — BUPIVACAINE-EPINEPHRINE (PF) 0.5% -1:200000 IJ SOLN
INTRAMUSCULAR | Status: DC | PRN
  Administered 2018-09-05: 30 mL

## 2018-09-05 MED ORDER — PHENYLEPHRINE HCL 10 MG/ML IJ SOLN
INTRAMUSCULAR | Status: DC | PRN
  Administered 2018-09-05 (×5): 100 ug via INTRAVENOUS

## 2018-09-05 MED ORDER — FENTANYL CITRATE (PF) 100 MCG/2ML IJ SOLN
INTRAMUSCULAR | Status: AC
  Filled 2018-09-05: qty 2

## 2018-09-05 SURGICAL SUPPLY — 35 items
APL PRP STRL LF DISP 70% ISPRP (MISCELLANEOUS) ×1
BLADE SURG 15 STRL SS SAFETY (BLADE) ×6 IMPLANT
CANISTER SUCT 1200ML W/VALVE (MISCELLANEOUS) ×3 IMPLANT
CHLORAPREP W/TINT 26 (MISCELLANEOUS) ×3 IMPLANT
CLOSURE WOUND 1/2 X4 (GAUZE/BANDAGES/DRESSINGS) ×1
COVER WAND RF STERILE (DRAPES) ×1 IMPLANT
DECANTER SPIKE VIAL GLASS SM (MISCELLANEOUS) ×3 IMPLANT
DRAIN PENROSE 1/4X12 LTX (DRAIN) ×3 IMPLANT
DRAPE LAPAROTOMY 100X77 ABD (DRAPES) ×3 IMPLANT
DRSG TEGADERM 4X4.75 (GAUZE/BANDAGES/DRESSINGS) ×3 IMPLANT
DRSG TELFA 4X3 1S NADH ST (GAUZE/BANDAGES/DRESSINGS) ×3 IMPLANT
ELECT REM PT RETURN 9FT ADLT (ELECTROSURGICAL) ×3
ELECTRODE REM PT RTRN 9FT ADLT (ELECTROSURGICAL) ×1 IMPLANT
GLOVE BIO SURGEON STRL SZ7.5 (GLOVE) ×3 IMPLANT
GLOVE INDICATOR 8.0 STRL GRN (GLOVE) ×3 IMPLANT
GOWN STRL REUS W/ TWL LRG LVL3 (GOWN DISPOSABLE) ×2 IMPLANT
GOWN STRL REUS W/TWL LRG LVL3 (GOWN DISPOSABLE) ×6
KIT TURNOVER KIT A (KITS) ×3 IMPLANT
LABEL OR SOLS (LABEL) ×3 IMPLANT
MESH MARLEX PLUG MEDIUM (Mesh General) ×3 IMPLANT
NEEDLE HYPO 22GX1.5 SAFETY (NEEDLE) ×8 IMPLANT
PACK BASIN MINOR ARMC (MISCELLANEOUS) ×3 IMPLANT
STRIP CLOSURE SKIN 1/2X4 (GAUZE/BANDAGES/DRESSINGS) ×2 IMPLANT
SUT PDS AB 0 CT1 27 (SUTURE) ×3 IMPLANT
SUT SURGILON 0 BLK (SUTURE) ×6 IMPLANT
SUT VIC AB 2-0 SH 27 (SUTURE) ×3
SUT VIC AB 2-0 SH 27XBRD (SUTURE) ×1 IMPLANT
SUT VIC AB 3-0 54X BRD REEL (SUTURE) ×1 IMPLANT
SUT VIC AB 3-0 BRD 54 (SUTURE) ×3
SUT VIC AB 3-0 SH 27 (SUTURE) ×3
SUT VIC AB 3-0 SH 27X BRD (SUTURE) ×1 IMPLANT
SUT VIC AB 4-0 FS2 27 (SUTURE) ×3 IMPLANT
SWABSTK COMLB BENZOIN TINCTURE (MISCELLANEOUS) ×3 IMPLANT
SYR 10ML LL (SYRINGE) ×3 IMPLANT
SYR 3ML LL SCALE MARK (SYRINGE) ×5 IMPLANT

## 2018-09-05 NOTE — Discharge Instructions (Signed)

## 2018-09-05 NOTE — Anesthesia Procedure Notes (Signed)
Procedure Name: LMA Insertion Date/Time: 09/05/2018 7:34 AM Performed by: Doreen Salvage, CRNA Pre-anesthesia Checklist: Patient identified, Patient being monitored, Timeout performed, Emergency Drugs available and Suction available Patient Re-evaluated:Patient Re-evaluated prior to induction Oxygen Delivery Method: Circle system utilized Preoxygenation: Pre-oxygenation with 100% oxygen Induction Type: IV induction Ventilation: Mask ventilation without difficulty LMA: LMA inserted LMA Size: 4.0 Tube type: Oral Number of attempts: 1 Placement Confirmation: positive ETCO2 and breath sounds checked- equal and bilateral Tube secured with: Tape Dental Injury: Teeth and Oropharynx as per pre-operative assessment

## 2018-09-05 NOTE — Anesthesia Postprocedure Evaluation (Signed)
Anesthesia Post Note  Patient: John Long.  Procedure(s) Performed: RECURRENT HERNIA REPAIR INGUINAL ADULT-LEFT (Left )  Patient location during evaluation: PACU Anesthesia Type: General Level of consciousness: awake and alert Pain management: pain level controlled Vital Signs Assessment: post-procedure vital signs reviewed and stable Respiratory status: spontaneous breathing, nonlabored ventilation and respiratory function stable Cardiovascular status: blood pressure returned to baseline and stable Postop Assessment: no apparent nausea or vomiting Anesthetic complications: no     Last Vitals:  Vitals:   09/05/18 0940 09/05/18 1057  BP:  130/76  Pulse:  61  Resp:  18  Temp: 36.6 C 36.9 C  SpO2:  100%    Last Pain:  Vitals:   09/05/18 1057  TempSrc: Temporal  PainSc: 0-No pain                 Durenda Hurt

## 2018-09-05 NOTE — Op Note (Signed)
Preoperative diagnosis: Recurrent left inguinal hernia.  Postoperative diagnosis: Same, indirect.  Operative procedure: Repair of recurrent left indirect inguinal hernia with medium Bard PerFix plug and patch.  Operating surgeon: Hervey Ard, MD.  Anesthesia: General by LMA, Marcaine 0.5% with 1 to 200,000 units of epinephrine, 30 cc; Toradol: 30 mg.  Estimated blood loss: Less than 5 cc.  Clinical note: This 78 year old male underwent repair of bilateral inguinal hernias in the spring 2019.  He developed a recurrence and is seen now for elective repair.  Operative note: Hair was removed from the surgical site prior to presentation of the operating theater.  He received Ancef 2 g intravenously prior to the surgery.  SCD stockings for DVT prevention.  Operative note: With the patient under adequate general anesthesia the lower abdomen and groin were cleansed with ChloraPrep and draped.  Field block anesthesia was placed.  A 5 cm incision along the anticipated course the inguinal canal was carried down through skin subtendinous tissue with hemostasis achieved by 3-0 Vicryl ties.  The external oblique was opened in the direction of its fibers.  The ilioinguinal nerve was identified but the iliohypogastric nerve was not.  A generous amount of cord tissue was noted.  The vas and vessels were separated along with some adipose tissue from the hernia sac.  This was dissected back to the level of the internal inguinal ring.  Moderate scarring was noted.  There was no direct defect appreciated.  Palpation through the medial aspect of the canal showed the mesh in place and apparently intact in this location.  The preperitoneal space was cleared enough to return the hernia sac into this area and then a medium Bard PerFix plug was placed and anchored into position with a single 0 Surgilon stitch between the transverse abdominis fascia and the iliopubic tract.  The onlay piece was placed and closed laterally  with interrupted 0 Surgilon sutures.  This was anchored to the pubic tubercle and then along the inguinal ligament with interrupted 0 Surgilon sutures.  The medial and superior borders were anchored to the transverse abdominis aponeurosis.  Toradol was placed in the wound and the external oblique closed with a running 2-0 Vicryl suture.  Scarpa's fascia was closed with a running 3-0 Vicryl suture.  Skin was closed with a running 4-0 Vicryl subcuticular suture.  Benzoin, Steri-Strips, Telfa and Tegaderm dressing applied.  The patient tolerated the procedure well was taken recovery room in stable condition.

## 2018-09-05 NOTE — Anesthesia Post-op Follow-up Note (Signed)
Anesthesia QCDR form completed.        

## 2018-09-05 NOTE — OR Nursing (Signed)
Dr. Bary Castilla in to see pt 1104 am -  Advises ok to d/c to home.

## 2018-09-05 NOTE — Anesthesia Preprocedure Evaluation (Signed)
Anesthesia Evaluation  Patient identified by MRN, date of birth, ID band Patient awake    Reviewed: Allergy & Precautions, H&P , NPO status , Patient's Chart, lab work & pertinent test results  Airway Mallampati: III  TM Distance: <3 FB     Dental  (+) Chipped   Pulmonary neg COPD, Recent URI , Resolved, former smoker,           Cardiovascular hypertension, (-) angina(-) Past MI, (-) Cardiac Stents and (-) CABG (-) dysrhythmias      Neuro/Psych negative neurological ROS  negative psych ROS   GI/Hepatic Neg liver ROS, GERD  Controlled,  Endo/Other  negative endocrine ROS  Renal/GU      Musculoskeletal   Abdominal   Peds  Hematology negative hematology ROS (+)   Anesthesia Other Findings Past Medical History: No date: Allergy No date: Enlarged prostate No date: Family history of colon cancer No date: GERD (gastroesophageal reflux disease) No date: Hypertension  Past Surgical History: No date: BLEPHAROPLASTY; Bilateral No date: COLONOSCOPY WITH PROPOFOL 07/24/2017: HERNIA REPAIR; Bilateral 02/08/2018: OPEN REDUCTION INTERNAL FIXATION (ORIF) DISTAL RADIAL  FRACTURE; Left     Comment:  Procedure: OPEN REDUCTION INTERNAL FIXATION (ORIF)               DISTAL RADIAL FRACTURE;  Surgeon: Hessie Knows, MD;                Location: [facility] ORS;  Service: Orthopedics;  Laterality:               Left; 1949: TONSILLECTOMY     Reproductive/Obstetrics negative OB ROS                             Anesthesia Physical Anesthesia Plan  ASA: II  Anesthesia Plan: General LMA   Post-op Pain Management:    Induction:   PONV Risk Score and Plan: Dexamethasone, Ondansetron, Midazolam and Treatment may vary due to age or medical condition  Airway Management Planned:   Additional Equipment:   Intra-op Plan:   Post-operative Plan:   Informed Consent: I have reviewed the patients History and  Physical, chart, labs and discussed the procedure including the risks, benefits and alternatives for the proposed anesthesia with the patient or authorized representative who has indicated his/her understanding and acceptance.     Dental Advisory Given  Plan Discussed with: Anesthesiologist and CRNA  Anesthesia Plan Comments:         Anesthesia Quick Evaluation

## 2018-09-05 NOTE — H&P (Signed)
John Long [id number] October 12, 1940     HPI:  78 y/o male with recurrent left inguinal hernia s/p laparoscopic surgery. For repair. URI symptoms have resolved.   Medications Prior to Admission  Medication Sig Dispense Refill Last Dose  . acetaminophen (TYLENOL) 500 MG tablet Take 500 mg by mouth every 8 (eight) hours as needed (PAIN).   09/04/2018 at Unknown time  . amLODipine (NORVASC) 5 MG tablet Take 5 mg by mouth daily.    09/04/2018 at Unknown time  . cholecalciferol (VITAMIN D3) 25 MCG (1000 UT) tablet Take 1,000 Units by mouth daily.   Past Month at Unknown time  . finasteride (PROSCAR) 5 MG tablet Take 5 mg by mouth daily.   09/04/2018 at Unknown time  . ibuprofen (ADVIL,MOTRIN) 200 MG tablet Take 200 mg by mouth every 8 (eight) hours as needed (PAIN).    Past Month at Unknown time  . ketoconazole (NIZORAL) 2 % shampoo Apply 1 application topically 2 (two) times a week.   Past Week at Unknown time  . Multiple Vitamins-Minerals (ICAPS AREDS 2 PO) Take 1 capsule by mouth daily.    Past Month at Unknown time  . sildenafil (REVATIO) 20 MG tablet Take 40-60 mg by mouth daily as needed.   Past Month at Unknown time  . telmisartan (MICARDIS) 80 MG tablet Take 80 mg by mouth daily.   09/02/2018  . triamcinolone cream (KENALOG) 0.1 % Apply 1 application topically daily.   09/02/2018   Allergies  Allergen Reactions  . Vasotec [Enalapril Maleate] Cough   Past Medical History:  Diagnosis Date  . Allergy   . Enlarged prostate   . Family history of colon cancer   . GERD (gastroesophageal reflux disease)   . Hypertension    Past Surgical History:  Procedure Laterality Date  . BLEPHAROPLASTY Bilateral   . COLONOSCOPY WITH PROPOFOL    . HERNIA REPAIR Bilateral 07/24/2017  . OPEN REDUCTION INTERNAL FIXATION (ORIF) DISTAL RADIAL FRACTURE Left 02/08/2018   Procedure: OPEN REDUCTION INTERNAL FIXATION (ORIF) DISTAL RADIAL FRACTURE;  Surgeon: Hessie Knows, MD;  Location: [facility] ORS;  Service:  Orthopedics;  Laterality: Left;  . TONSILLECTOMY  1949   Social History   Socioeconomic History  . Marital status: Married    Spouse name: Not on file  . Number of children: Not on file  . Years of education: Not on file  . Highest education level: Not on file  Occupational History  . Not on file  Social Needs  . Financial resource strain: Not on file  . Food insecurity:    Worry: Not on file    Inability: Not on file  . Transportation needs:    Medical: Not on file    Non-medical: Not on file  Tobacco Use  . Smoking status: Former Smoker    Packs/day: 1.00    Years: 20.00    Pack years: 20.00    Types: Cigarettes    Last attempt to quit: 06/20/1997    Years since quitting: 21.2  . Smokeless tobacco: Never Used  Substance and Sexual Activity  . Alcohol use: Yes    Alcohol/week: 14.0 standard drinks    Types: 14 Glasses of wine per week    Comment: 1-2/day  . Drug use: No  . Sexual activity: Yes  Lifestyle  . Physical activity:    Days per week: Not on file    Minutes per session: Not on file  . Stress: Not on file  Relationships  .  Social connections:    Talks on phone: Not on file    Gets together: Not on file    Attends religious service: Not on file    Active member of club or organization: Not on file    Attends meetings of clubs or organizations: Not on file    Relationship status: Not on file  . Intimate partner violence:    Fear of current or ex partner: Not on file    Emotionally abused: Not on file    Physically abused: Not on file    Forced sexual activity: Not on file  Other Topics Concern  . Not on file  Social History Narrative  . Not on file   Social History   Social History Narrative  . Not on file     ROS: Negative.     PE: HEENT: Negative. Lungs: Clear. Cardio: RRForest Gleason Tess Potts 09/05/2018   Assessment/Plan:  Proceed with planned left inguinal hernia repair.

## 2018-09-05 NOTE — Transfer of Care (Signed)
Immediate Anesthesia Transfer of Care Note  Patient: John Long.  Procedure(s) Performed: Procedure(s): RECURRENT HERNIA REPAIR INGUINAL ADULT-LEFT (Left)  Patient Location: PACU  Anesthesia Type:General  Level of Consciousness: sedated  Airway & Oxygen Therapy: Patient Spontanous Breathing and Patient connected to face mask oxygen  Post-op Assessment: Report given to RN and Post -op Vital signs reviewed and stable  Post vital signs: Reviewed and stable  Last Vitals:  Vitals:   09/05/18 0623 09/05/18 0859  BP: (!) 142/87 128/75  Pulse: 68 71  Resp: 16 14  Temp: 37.3 C 36.7 C  SpO2: 938% 182%    Complications: No apparent anesthesia complications

## 2018-09-13 ENCOUNTER — Other Ambulatory Visit: Payer: Self-pay

## 2018-09-13 ENCOUNTER — Ambulatory Visit (INDEPENDENT_AMBULATORY_CARE_PROVIDER_SITE_OTHER): Payer: Medicare Other | Admitting: General Surgery

## 2018-09-13 ENCOUNTER — Encounter: Payer: Self-pay | Admitting: General Surgery

## 2018-09-13 VITALS — BP 129/83 | HR 68 | Temp 97.5°F | Resp 14 | Ht 67.0 in | Wt 166.8 lb

## 2018-09-13 DIAGNOSIS — K4091 Unilateral inguinal hernia, without obstruction or gangrene, recurrent: Secondary | ICD-10-CM

## 2018-09-13 NOTE — Patient Instructions (Addendum)
Patient is to return to the office in 1 month, resume normal activities with comfort if it hurts don't do it. Heat may help. No core exercises.   Call the office with any questions or concerns.

## 2018-09-13 NOTE — Progress Notes (Signed)
Patient ID: John Ser., male   DOB: 01-21-1941, 78 y.o.   MRN: [id number]  Chief Complaint  Patient presents with  . Routine Post Op    post op RECURRENT HERNIA REPAIR INGUINAL ADULT-LEFT Dr. Bary Castilla 09/05/2018    HPI Otila Back Carvell Hoeffner. is a 78 y.o. male here today for post op follow up for left inguinal hernia repair sx date 09/05/2018. Patient states he is pleased with how he is feeling.  He made use of Aleve for the first 4 days twice daily, now just on a chest dose.  No difficulty with bowel or bladder function. HPI  Past Medical History:  Diagnosis Date  . Allergy   . Enlarged prostate   . Family history of colon cancer   . GERD (gastroesophageal reflux disease)   . Hypertension     Past Surgical History:  Procedure Laterality Date  . BLEPHAROPLASTY Bilateral   . COLONOSCOPY WITH PROPOFOL    . HERNIA REPAIR Bilateral 07/24/2017  . INGUINAL HERNIA REPAIR Left 09/05/2018   Medium Bard Perfix Plug; RECURRENT HERNIA REPAIR INGUINAL ADULT-LEFT;  Surgeon: Robert Bellow, MD;  Location: [facility];  Service: General;  Laterality: Left;  . OPEN REDUCTION INTERNAL FIXATION (ORIF) DISTAL RADIAL FRACTURE Left 02/08/2018   Procedure: OPEN REDUCTION INTERNAL FIXATION (ORIF) DISTAL RADIAL FRACTURE;  Surgeon: Hessie Knows, MD;  Location: [facility];  Service: Orthopedics;  Laterality: Left;  . TONSILLECTOMY  1949    Family History  Problem Relation Age of Onset  . Colon cancer Father 48  . Dementia Mother   . Esophageal cancer Neg Hx   . Stomach cancer Neg Hx   . Rectal cancer Neg Hx     Social History Social History   Tobacco Use  . Smoking status: Former Smoker    Packs/day: 1.00    Years: 20.00    Pack years: 20.00    Types: Cigarettes    Last attempt to quit: 06/20/1997    Years since quitting: 21.2  . Smokeless tobacco: Never Used  Substance Use Topics  . Alcohol use: Yes    Alcohol/week: 14.0 standard drinks    Types: 14 Glasses of wine per week     Comment: 1-2/day  . Drug use: No    Allergies  Allergen Reactions  . Vasotec [Enalapril Maleate] Cough    Current Outpatient Medications  Medication Sig Dispense Refill  . amLODipine (NORVASC) 5 MG tablet Take 5 mg by mouth daily.     . cholecalciferol (VITAMIN D3) 25 MCG (1000 UT) tablet Take 1,000 Units by mouth daily.    . finasteride (PROSCAR) 5 MG tablet Take 5 mg by mouth daily.    Marland Kitchen ketoconazole (NIZORAL) 2 % shampoo Apply 1 application topically 2 (two) times a week.    . Multiple Vitamins-Minerals (ICAPS AREDS 2 PO) Take 1 capsule by mouth daily.     . sildenafil (REVATIO) 20 MG tablet Take 40-60 mg by mouth daily as needed.    Marland Kitchen telmisartan (MICARDIS) 80 MG tablet Take 80 mg by mouth daily.    Marland Kitchen triamcinolone cream (KENALOG) 0.1 % Apply 1 application topically daily.    Marland Kitchen HYDROcodone-acetaminophen (NORCO/VICODIN) 5-325 MG tablet Take 1 tablet by mouth every 4 (four) hours as needed for moderate pain. (Patient not taking: Reported on 09/13/2018) 15 tablet 0   No current facility-administered medications for this visit.     Review of Systems Review of Systems  Constitutional: Negative.   Respiratory: Negative.   Cardiovascular:  Negative.     Blood pressure 129/83, pulse 68, temperature (!) 97.5 F (36.4 C), temperature source Temporal, resp. rate 14, height 5\' 7"  (1.702 m), weight 166 lb 12.8 oz (75.7 kg), SpO2 96 %.  Physical Exam Physical Exam Constitutional:      Appearance: He is well-developed.  Eyes:     General: No scleral icterus.    Conjunctiva/sclera: Conjunctivae normal.  Genitourinary:   Skin:    General: Skin is warm and dry.  Neurological:     Mental Status: He is alert and oriented to person, place, and time.     Data Reviewed The patient showed evidence of an indirect recurrence.  Assessment Doing well post recurrent left inguinal hernia repair.  Numbness in the distribution of the iliohypogastric nerve.  Plan 20 pound weight  restriction at this time.    No core exercises at this time.  HPI, Physical Exam, Assessment and Plan have been scribed under the direction and in the presence of Hervey Ard, Md.  Eudelia Bunch R. Bobette Mo, CMA  I have completed the exam and reviewed the above documentation for accuracy and completeness.  I agree with the above.  Haematologist has been used and any errors in dictation or transcription are unintentional.  Hervey Ard, M.D., F.A.C.S.  [name] Byrnett 09/13/2018, 9:43 AM

## 2018-10-03 ENCOUNTER — Telehealth: Payer: Self-pay | Admitting: General Surgery

## 2018-10-03 NOTE — Telephone Encounter (Signed)
The patient may gradually increase the amount of weight he lifts over the next 4-8 weeks.  I would not recommend core strengthening exercises at this time.

## 2018-10-03 NOTE — Telephone Encounter (Signed)
When calling the patient to move his appointment out a month per your request, patient has now been moved to May, 26 and is now asking about his restrictions will they stay the same you had put the patient on a 20 pound weight restriction at this time. No core exercises at this time that was DOS 09/13/18. Please advise.

## 2018-10-05 NOTE — Telephone Encounter (Signed)
Informed patient of this information ?

## 2018-10-16 ENCOUNTER — Encounter: Payer: Medicare Other | Admitting: General Surgery

## 2018-10-22 ENCOUNTER — Other Ambulatory Visit: Payer: Self-pay | Admitting: Urology

## 2018-10-22 DIAGNOSIS — R972 Elevated prostate specific antigen [PSA]: Secondary | ICD-10-CM

## 2018-11-13 ENCOUNTER — Encounter: Payer: Self-pay | Admitting: General Surgery

## 2018-11-13 ENCOUNTER — Ambulatory Visit (INDEPENDENT_AMBULATORY_CARE_PROVIDER_SITE_OTHER): Payer: Medicare Other | Admitting: General Surgery

## 2018-11-13 ENCOUNTER — Other Ambulatory Visit: Payer: Self-pay

## 2018-11-13 VITALS — BP 128/74 | HR 73 | Temp 97.5°F | Ht 67.0 in | Wt 167.0 lb

## 2018-11-13 DIAGNOSIS — K4091 Unilateral inguinal hernia, without obstruction or gangrene, recurrent: Secondary | ICD-10-CM

## 2018-11-13 DIAGNOSIS — R600 Localized edema: Secondary | ICD-10-CM

## 2018-11-13 NOTE — Patient Instructions (Addendum)
The patient is aware to call back for any questions or new concerns. Consider Referral to Vascular

## 2018-11-13 NOTE — Progress Notes (Signed)
Patient ID: John Ser., male   DOB: June 13, 1941, 78 y.o.   MRN: [id number]  Chief Complaint  Patient presents with  . Follow-up    HPI John John Long. is a 78 y.o. male.  Here for follow up left inguinal hernia repair on 09-05-18. He states he is doing well.  The patient reports no discomfort at the surgical site.  No difficulty with bowel or bladder function.  He does admit to bilateral lower leg swelling and joint John Long.  This reportedly began with the institution of amlodipine for blood pressure control.  With the development of lower extremity edema this was stopped 4 weeks ago, but no improvement is been noted in the swelling by patient report.   Blood pressure is now under good control.  HPI  Past Medical History:  Diagnosis Date  . Allergy   . Enlarged prostate   . Family history of colon cancer   . GERD (gastroesophageal reflux disease)   . Hypertension     Past Surgical History:  Procedure Laterality Date  . BLEPHAROPLASTY Bilateral   . COLONOSCOPY WITH PROPOFOL    . HERNIA REPAIR Bilateral 07/24/2017  . INGUINAL HERNIA REPAIR Left 09/05/2018   Medium Bard Perfix Plug; RECURRENT HERNIA REPAIR INGUINAL ADULT-LEFT;  Surgeon: John Bellow, MD;  Location: [facility];  Service: General;  Laterality: Left;  . OPEN REDUCTION INTERNAL FIXATION (ORIF) DISTAL RADIAL FRACTURE Left 02/08/2018   Procedure: OPEN REDUCTION INTERNAL FIXATION (ORIF) DISTAL RADIAL FRACTURE;  Surgeon: Hessie Knows, MD;  Location: [facility];  Service: Orthopedics;  Laterality: Left;  . TONSILLECTOMY  1949    Family History  Problem Relation Age of Onset  . Colon cancer Father 4  . Dementia Mother   . Esophageal cancer Neg Hx   . Stomach cancer Neg Hx   . Rectal cancer Neg Hx     Social History Social History   Tobacco Use  . Smoking status: Former Smoker    Packs/day: 1.00    Years: 20.00    Pack years: 20.00    Types: Cigarettes    Last attempt to quit: 06/20/1997    Years  since quitting: 21.4  . Smokeless tobacco: Never Used  Substance Use Topics  . Alcohol use: Yes    Alcohol/week: 14.0 standard drinks    Types: 14 Glasses of wine per week    Comment: 1-2/day  . Drug use: No    Allergies  Allergen Reactions  . Vasotec [Enalapril Maleate] Cough    Current Outpatient Medications  Medication Sig Dispense Refill  . cholecalciferol (VITAMIN D3) 25 MCG (1000 UT) tablet Take 1,000 Units by mouth daily.    Marland Kitchen doxazosin (CARDURA) 2 MG tablet Take 2 mg by mouth daily.     . finasteride (PROSCAR) 5 MG tablet Take 5 mg by mouth daily.    Marland Kitchen ketoconazole (NIZORAL) 2 % shampoo Apply 1 application topically 2 (two) times a week.    . Multiple Vitamins-Minerals (ICAPS AREDS 2 PO) Take 1 capsule by mouth daily.     . sildenafil (REVATIO) 20 MG tablet Take 40-60 mg by mouth daily as needed.    Marland Kitchen telmisartan (MICARDIS) 80 MG tablet Take 80 mg by mouth daily.    Marland Kitchen triamcinolone cream (KENALOG) 0.1 % Apply 1 application topically daily.     No current facility-administered medications for this visit.     Review of Systems Review of Systems  Constitutional: Negative.   Respiratory: Negative.   Cardiovascular: Negative.  Gastrointestinal: Negative for abdominal John Long, constipation and diarrhea.    Blood pressure 128/74, pulse 73, temperature (!) 97.5 F (36.4 C), temperature source Temporal, height 5\' 7"  (1.702 m), weight 167 lb (75.8 kg), SpO2 97 %.  The patient's weight is up 1 pound from his March 2020 exam.  Physical Exam Physical Exam Exam conducted with a chaperone present.  Constitutional:      Appearance: He is well-developed.  HENT:     Mouth/Throat:     Pharynx: No oropharyngeal exudate.  Eyes:     General: No scleral icterus.    Conjunctiva/sclera: Conjunctivae normal.  Neck:     Musculoskeletal: Neck supple.  Cardiovascular:     Rate and Rhythm: Normal rate and regular rhythm.     Heart sounds: Normal heart sounds. Heart sounds not  distant. No murmur.     Comments: Calf measurement at a location 7 cm below the tibial tubercle: 40 cm bilaterally. Pulmonary:     Effort: Pulmonary effort is normal.     Breath sounds: Normal breath sounds. No rales.  Abdominal:     General: Bowel sounds are normal.     Palpations: Abdomen is soft.     Tenderness: There is no abdominal tenderness.     Hernia: No hernia is present.  Genitourinary:    Scrotum/Testes: Normal.    Musculoskeletal:     Right lower leg: 2+ Pitting Edema present.     Left lower leg: 2+ Pitting Edema present.  Skin:    General: Skin is warm and dry.  Neurological:     Mental Status: He is alert and oriented to person, place, and time.  Psychiatric:        Behavior: Behavior normal.     Assessment Doing well post repair of recurrent left inguinal hernia with Bard PerFix plug.  Persistent lower extremity edema without evidence of right-sided congestive failure.  Plan The case was reviewed with John Pain, MD via secure chat.  Appropriate candidate for formal vascular evaluation, we will arrange for consultation.    Follow up as needed in regards to his hernia..  Proper lifting technique reviewed.  The patient is aware to call back for any questions or new concerns.   HPI, assessment, plan and physical exam has been scribed under the direction and in the presence of John Bellow, MD. John Fetch, RN  I have completed the exam and reviewed the above documentation for accuracy and completeness.  I agree with the above.  Haematologist has been used and any errors in dictation or transcription are unintentional.  John John Long, M.D., F.A.C.S.  John John Long 11/13/2018, 8:57 PM

## 2018-11-14 ENCOUNTER — Telehealth: Payer: Self-pay | Admitting: *Deleted

## 2018-11-14 DIAGNOSIS — R600 Localized edema: Secondary | ICD-10-CM

## 2018-11-14 NOTE — Telephone Encounter (Signed)
Referral made to Dr. Lucky Cowboy.   Their office will be contacting patient regarding an appointment.

## 2018-11-14 NOTE — Telephone Encounter (Signed)
-----   Message from Robert Bellow, MD sent at 11/13/2018  8:51 PM EDT ----- Please make a referral to Dr. Lucky Cowboy regarding bilateral lower extremity edema without evidence of heart failure.  Thank you

## 2018-11-27 ENCOUNTER — Other Ambulatory Visit: Payer: Self-pay

## 2018-11-27 ENCOUNTER — Ambulatory Visit
Admission: RE | Admit: 2018-11-27 | Discharge: 2018-11-27 | Disposition: A | Discharge status: Home / Self Care | Payer: Medicare Other | Source: Ambulatory Visit | Attending: Urology | Admitting: Urology

## 2018-11-27 DIAGNOSIS — R972 Elevated prostate specific antigen [PSA]: Secondary | ICD-10-CM

## 2018-11-27 IMAGING — MR MR PROSTATE WO/W CM
56 series · 56 of 56 positions shown · IV contrast (Multihance 15ml)
Comparison: CT pelvis [DATE].

CLINICAL DATA: Prostatomegaly with elevated PSA level of 6.80 on
[DATE]. Biopsy [DATE] revealed high-grade prosthetic
intraepithelial neoplasia at the left lateral mid gland and left
apex.

EXAM:
MR PROSTATE WITHOUT AND WITH CONTRAST
TECHNIQUE: Multiplanar multisequence MRI images were obtained of the pelvis
centered about the prostate. Pre and post contrast images were
obtained.
CONTRAST:  15mL MULTIHANCE GADOBENATE DIMEGLUMINE 529 MG/ML IV SOLN
Creatinine was obtained on site at [HOSPITAL] at [HOSPITAL].
Results: Creatinine 0.9 mg/dL.

[Series 2: new loc · axial · 6.0mm · 0.88mm/px · 1 of 17 slices shown]
[im 1/17]
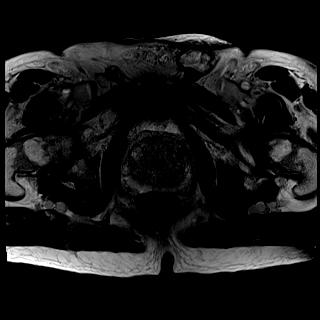

[Series 3: T1 · axial · 8.0mm · 1.06mm/px · 1 of 28 slices shown (1 of 2)]
[im 1/28]
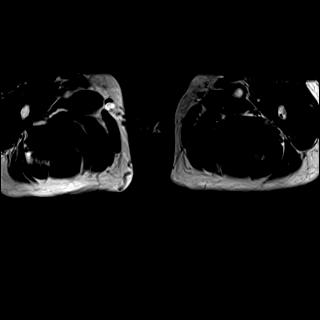

[Series 4: bSSFP fat-sat · axial · 8.0mm · 0.74mm/px · 1 of 28 slices shown]
[im 1/28]
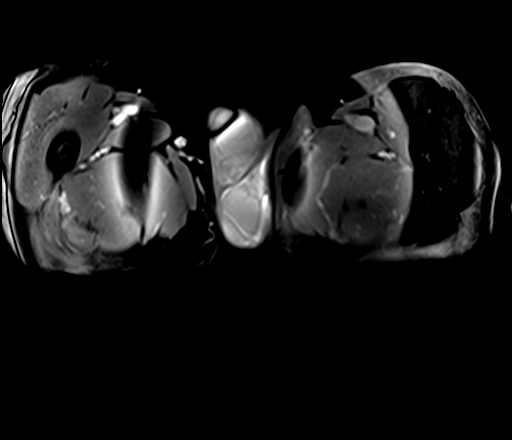

[Series 5: T2 · sagittal · 3.5mm · 0.56mm/px · 1 of 39 slices shown (1 of 4)]
[im 1/39]
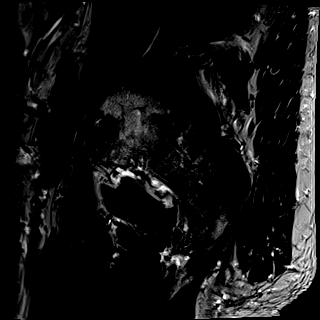

[Series 6: T1 · axial · 3.0mm · 0.31mm/px · 1 of 24 slices shown (2 of 2)]
[im 1/24]
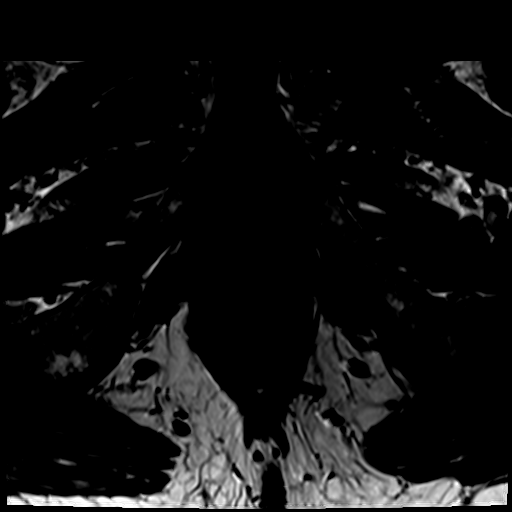

[Series 7: T2 · axial · 3.5mm · 0.56mm/px · 1 of 23 slices shown (2 of 4)]
[im 1/23]
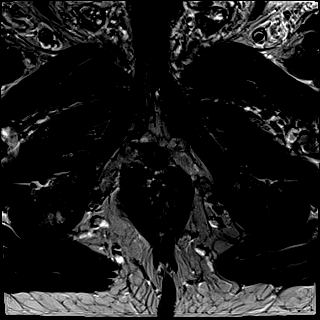

[Series 8: T2 · axial · 1.0mm · 1.04mm/px · 1 of 80 slices shown (3 of 4)]
[im 1/80]
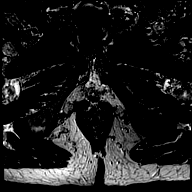

[Series 9: T2 · coronal · 3.5mm · 0.56mm/px · 1 of 23 slices shown (4 of 4)]
[im 1/23]
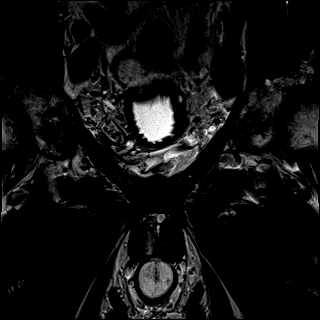

[Series 10: DWI · axial · 3.5mm · 1.56mm/px · 1 of 60 slices shown (1 of 2)]
[im 1/60]
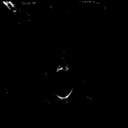

[Series 11: DWI · axial · 3.5mm · 1.56mm/px · 1 of 19 slices shown (2 of 2)]
[im 1/19]
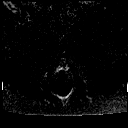

[Series 12: pre t1_twist_tra_dyn_ttc=5.3s · axial · non-contrast · 3.5mm · 0.83mm/px · 1 of 20 slices shown]
[im 1/20]
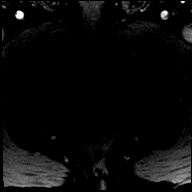

[Series 13: post t1_twist_tra_dyn-copy center · axial · 3.5mm · 0.83mm/px · 1 of 20 slices shown (1 of 23)]
[im 1/20]
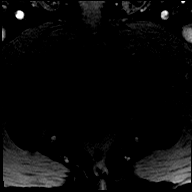

[Series 14: post t1_twist_tra_dyn-copy center · axial · 3.5mm · 0.83mm/px · 1 of 20 slices shown (2 of 23)]
[im 1/20]
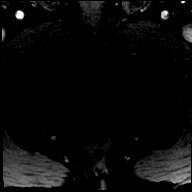

[Series 15: post t1_twist_tra_dyn-copy cent_sub_ttc=(id) · axial · 3.5mm · 0.83mm/px · 1 of 7 slices shown (1 of 22)]
[im 1/7]
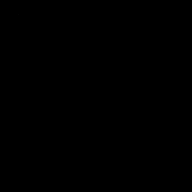

[Series 16: post t1_twist_tra_dyn-copy center · axial · 3.5mm · 0.83mm/px · 1 of 20 slices shown (3 of 23)]
[im 1/20]
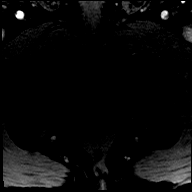

[Series 17: post t1_twist_tra_dyn-copy cent_sub_ttc=(id) · axial · 3.5mm · 0.83mm/px · 1 of 20 slices shown (2 of 22)]
[im 1/20]
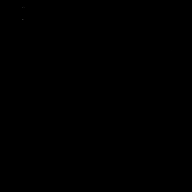

[Series 18: post t1_twist_tra_dyn-copy center · axial · 3.5mm · 0.83mm/px · 1 of 20 slices shown (4 of 23)]
[im 1/20]
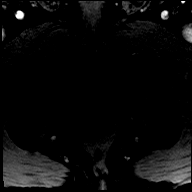

[Series 19: post t1_twist_tra_dyn-copy cent_sub_ttc=(id) · axial · 3.5mm · 0.83mm/px · 1 of 20 slices shown (3 of 22)]
[im 1/20]
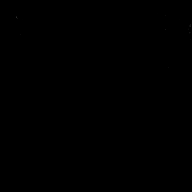

[Series 20: post t1_twist_tra_dyn-copy center · axial · 3.5mm · 0.83mm/px · 1 of 20 slices shown (5 of 23)]
[im 1/20]
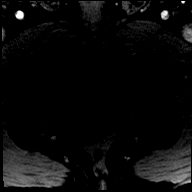

[Series 21: post t1_twist_tra_dyn-copy cent_sub_ttc=(id) · axial · 3.5mm · 0.83mm/px · 1 of 20 slices shown (4 of 22)]
[im 1/20]
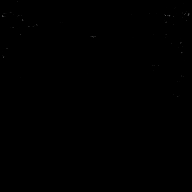

[Series 22: post t1_twist_tra_dyn-copy center · axial · 3.5mm · 0.83mm/px · 1 of 20 slices shown (6 of 23)]
[im 1/20]
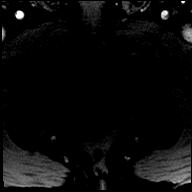

[Series 23: post t1_twist_tra_dyn-copy cent_sub_ttc=(id) · axial · 3.5mm · 0.83mm/px · 1 of 20 slices shown (5 of 22)]
[im 1/20]
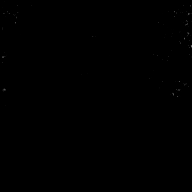

[Series 24: post t1_twist_tra_dyn-copy center · axial · 3.5mm · 0.83mm/px · 1 of 20 slices shown (7 of 23)]
[im 1/20]
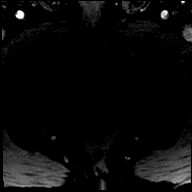

[Series 25: post t1_twist_tra_dyn-copy cent_sub_ttc=(id) · axial · 3.5mm · 0.83mm/px · 1 of 20 slices shown (6 of 22)]
[im 1/20]
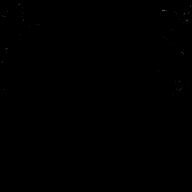

[Series 26: post t1_twist_tra_dyn-copy center · axial · 3.5mm · 0.83mm/px · 1 of 20 slices shown (8 of 23)]
[im 1/20]
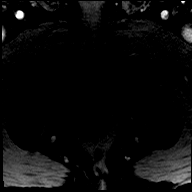

[Series 27: post t1_twist_tra_dyn-copy cent_sub_ttc=(id) · axial · 3.5mm · 0.83mm/px · 1 of 20 slices shown (7 of 22)]
[im 1/20]
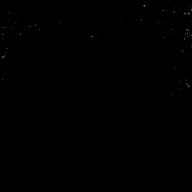

[Series 28: post t1_twist_tra_dyn-copy center · axial · 3.5mm · 0.83mm/px · 1 of 20 slices shown (9 of 23)]
[im 1/20]
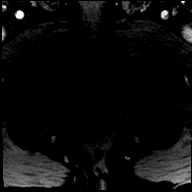

[Series 29: post t1_twist_tra_dyn-copy cent_sub_ttc=(id) · axial · 3.5mm · 0.83mm/px · 1 of 20 slices shown (8 of 22)]
[im 1/20]
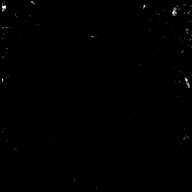

[Series 30: post t1_twist_tra_dyn-copy center · axial · 3.5mm · 0.83mm/px · 1 of 20 slices shown (10 of 23)]
[im 1/20]
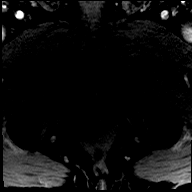

[Series 31: post t1_twist_tra_dyn-copy cent_sub_ttc=(id) · axial · 3.5mm · 0.83mm/px · 1 of 20 slices shown (9 of 22)]
[im 1/20]
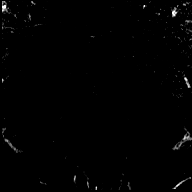

[Series 32: post t1_twist_tra_dyn-copy center · axial · 3.5mm · 0.83mm/px · 1 of 20 slices shown (11 of 23)]
[im 1/20]
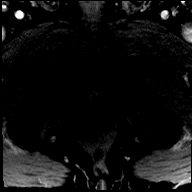

[Series 33: post t1_twist_tra_dyn-copy cent_sub_ttc=(id) · axial · 3.5mm · 0.83mm/px · 1 of 20 slices shown (10 of 22)]
[im 1/20]
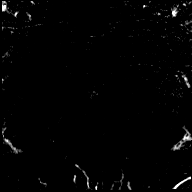

[Series 34: post t1_twist_tra_dyn-copy center · axial · 3.5mm · 0.83mm/px · 1 of 20 slices shown (12 of 23)]
[im 1/20]
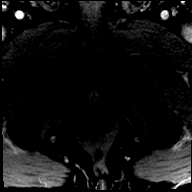

[Series 35: post t1_twist_tra_dyn-copy cent_sub_ttc=(id) · axial · 3.5mm · 0.83mm/px · 1 of 20 slices shown (11 of 22)]
[im 1/20]
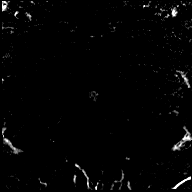

[Series 36: post t1_twist_tra_dyn-copy center · axial · 3.5mm · 0.83mm/px · 1 of 20 slices shown (13 of 23)]
[im 1/20]
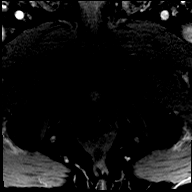

[Series 37: post t1_twist_tra_dyn-copy cent_sub_ttc=(id) · axial · 3.5mm · 0.83mm/px · 1 of 20 slices shown (12 of 22)]
[im 1/20]
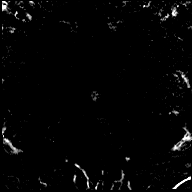

[Series 38: post t1_twist_tra_dyn-copy center · axial · 3.5mm · 0.83mm/px · 1 of 20 slices shown (14 of 23)]
[im 1/20]
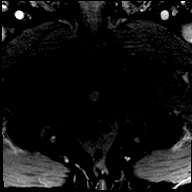

[Series 39: post t1_twist_tra_dyn-copy cent_sub_ttc=(id) · axial · 3.5mm · 0.83mm/px · 1 of 20 slices shown (13 of 22)]
[im 1/20]
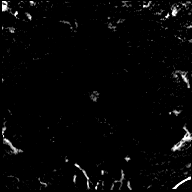

[Series 40: post t1_twist_tra_dyn-copy center · axial · 3.5mm · 0.83mm/px · 1 of 20 slices shown (15 of 23)]
[im 1/20]
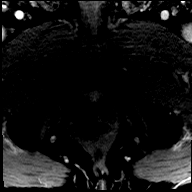

[Series 41: post t1_twist_tra_dyn-copy cent_sub_ttc=(id) · axial · 3.5mm · 0.83mm/px · 1 of 20 slices shown (14 of 22)]
[im 1/20]
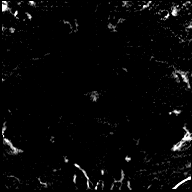

[Series 42: post t1_twist_tra_dyn-copy center · axial · 3.5mm · 0.83mm/px · 1 of 20 slices shown (16 of 23)]
[im 1/20]
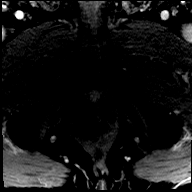

[Series 43: post t1_twist_tra_dyn-copy cent_sub_ttc=(id) · axial · 3.5mm · 0.83mm/px · 1 of 20 slices shown (15 of 22)]
[im 1/20]
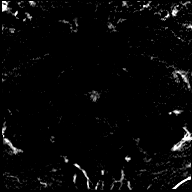

[Series 44: post t1_twist_tra_dyn-copy center · axial · 3.5mm · 0.83mm/px · 1 of 20 slices shown (17 of 23)]
[im 1/20]
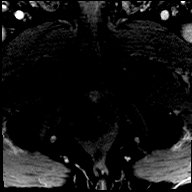

[Series 45: post t1_twist_tra_dyn-copy cent_sub_ttc=(id) · axial · 3.5mm · 0.83mm/px · 1 of 20 slices shown (16 of 22)]
[im 1/20]
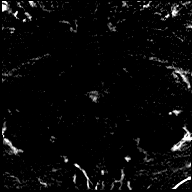

[Series 46: post t1_twist_tra_dyn-copy center · axial · 3.5mm · 0.83mm/px · 1 of 20 slices shown (18 of 23)]
[im 1/20]
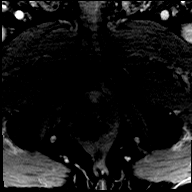

[Series 47: post t1_twist_tra_dyn-copy cent_sub_ttc=(id) · axial · 3.5mm · 0.83mm/px · 1 of 20 slices shown (17 of 22)]
[im 1/20]
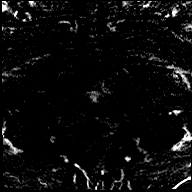

[Series 48: post t1_twist_tra_dyn-copy center · axial · 3.5mm · 0.83mm/px · 1 of 20 slices shown (19 of 23)]
[im 1/20]
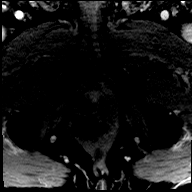

[Series 49: post t1_twist_tra_dyn-copy cent_sub_ttc=(id) · axial · 3.5mm · 0.83mm/px · 1 of 20 slices shown (18 of 22)]
[im 1/20]
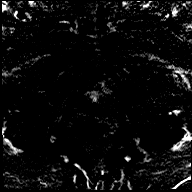

[Series 50: post t1_twist_tra_dyn-copy center · axial · 3.5mm · 0.83mm/px · 1 of 20 slices shown (20 of 23)]
[im 1/20]
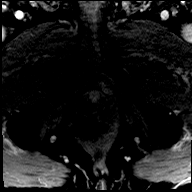

[Series 51: post t1_twist_tra_dyn-copy cent_sub_ttc=(id) · axial · 3.5mm · 0.83mm/px · 1 of 20 slices shown (19 of 22)]
[im 1/20]
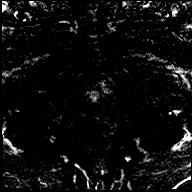

[Series 52: post t1_twist_tra_dyn-copy center · axial · 3.5mm · 0.83mm/px · 1 of 20 slices shown (21 of 23)]
[im 1/20]
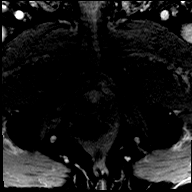

[Series 53: post t1_twist_tra_dyn-copy cent_sub_ttc=(id) · axial · 3.5mm · 0.83mm/px · 1 of 20 slices shown (20 of 22)]
[im 1/20]
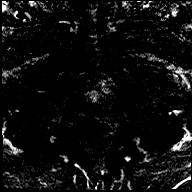

[Series 54: post t1_twist_tra_dyn-copy center · axial · 3.5mm · 0.83mm/px · 1 of 20 slices shown (22 of 23)]
[im 1/20]
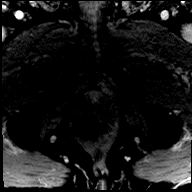

[Series 55: post t1_twist_tra_dyn-copy cent_sub_ttc=(id) · axial · 3.5mm · 0.83mm/px · 1 of 20 slices shown (21 of 22)]
[im 1/20]
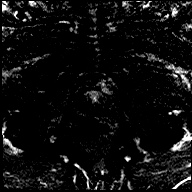

[Series 56: post t1_twist_tra_dyn-copy center · axial · 3.5mm · 0.83mm/px · 1 of 20 slices shown (23 of 23)]
[im 1/20]
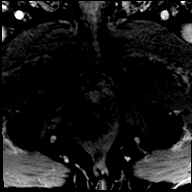

[Series 57: post t1_twist_tra_dyn-copy cent_sub_ttc=(id) · axial · 3.5mm · 0.83mm/px · 1 of 20 slices shown (22 of 22)]
[im 1/20]
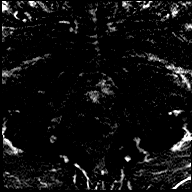

[56 of 56 positions shown; findings below may reference images not displayed]

FINDINGS: Prostate: Region of interest # 1: PI-RADS category 4 left apical
posterolateral and to a lesser extent posteromedial peripheral zone
lesion with reduced T2 and mildly reduced ADC map activity, faintly
increased high B value diffusion weighted activity, and low-grade
early enhancement. This lesion measures 0.72 cubic cm (1.9 by 0.8 by
0.9 cm).

Prominence of the central gland compatible with benign prostatic
hypertrophy.

Volume: 3D volumetric analysis: 106.98 cubic cm (6.5 by 5.7 by
cm).

Transcapsular spread:  Absent

Seminal vesicle involvement: Absent

Neurovascular bundle involvement: Absent

Pelvic adenopathy: Absent

Bone metastasis: Absent

Other findings: Left inguinal hernia contains adipose tissue.
IMPRESSION: 1. PI-RADS category 4 lesion primarily in the left apically
posterolateral peripheral zone and to a lesser extent extending into
the posteromedial peripheral zone, measuring 0.72 cubic cm.
Targeting data sent to [facility].
2. Benign prostatic hypertrophy.
3. Prostate volume: Approximately 107 cubic cm.
4. Left inguinal hernia contains adipose tissue.

## 2018-11-27 MED ORDER — GADOBENATE DIMEGLUMINE 529 MG/ML IV SOLN
15.0000 mL | Freq: Once | INTRAVENOUS | Status: AC | PRN
  Administered 2018-11-27: 15 mL via INTRAVENOUS

## 2018-12-11 ENCOUNTER — Ambulatory Visit (INDEPENDENT_AMBULATORY_CARE_PROVIDER_SITE_OTHER): Payer: Medicare Other | Admitting: Vascular Surgery

## 2018-12-11 ENCOUNTER — Encounter (INDEPENDENT_AMBULATORY_CARE_PROVIDER_SITE_OTHER): Payer: Self-pay | Admitting: Vascular Surgery

## 2018-12-11 ENCOUNTER — Other Ambulatory Visit: Payer: Self-pay

## 2018-12-11 DIAGNOSIS — Z87891 Personal history of nicotine dependence: Secondary | ICD-10-CM | POA: Diagnosis not present

## 2018-12-11 DIAGNOSIS — E871 Hypo-osmolality and hyponatremia: Secondary | ICD-10-CM

## 2018-12-11 DIAGNOSIS — Z79899 Other long term (current) drug therapy: Secondary | ICD-10-CM

## 2018-12-11 DIAGNOSIS — I10 Essential (primary) hypertension: Secondary | ICD-10-CM | POA: Insufficient documentation

## 2018-12-11 DIAGNOSIS — M7989 Other specified soft tissue disorders: Secondary | ICD-10-CM

## 2018-12-11 NOTE — Progress Notes (Signed)
Patient ID: John Ser., male   DOB: [birth year], 78 y.o.   MRN: [id number]  Chief Complaint  Patient presents with   New Patient (Initial Visit)    HPI John Long. is a 78 y.o. male.  I am asked to see the patient by Dr. Bary Long for evaluation of leg swelling.  This has been a problem over the past few months.  It was originally felt it was likely due to a side effect of amlodipine, but discontinuation of amlodipine did not resolve the swelling.  He has had some mild improvement in swelling over the past few weeks.  He has been elevating his legs more which may have helped the swelling.  He also had some issues with hyponatremia and was taking salt tablets.  He is scheduled to have his electrolytes rechecked within a week.  With his hyponatremia, his diuretic was removed from his therapy as well.  He has no antecedent history of DVT or superficial thrombophlebitis.  Both legs are affected about the same.  The legs are heavy but not overtly painful.  He has no ulceration or infection.  No fever or chills.     Past Medical History:  Diagnosis Date   Allergy    Enlarged prostate    Family history of colon cancer    GERD (gastroesophageal reflux disease)    Hypertension     Past Surgical History:  Procedure Laterality Date   BLEPHAROPLASTY Bilateral    COLONOSCOPY WITH PROPOFOL     HERNIA REPAIR Bilateral 07/24/2017   INGUINAL HERNIA REPAIR Left 09/05/2018   Medium Bard Perfix Plug; RECURRENT HERNIA REPAIR INGUINAL ADULT-LEFT;  Surgeon: John Bellow, MD;  Location: [facility];  Service: General;  Laterality: Left;   OPEN REDUCTION INTERNAL FIXATION (ORIF) DISTAL RADIAL FRACTURE Left 02/08/2018   Procedure: OPEN REDUCTION INTERNAL FIXATION (ORIF) DISTAL RADIAL FRACTURE;  Surgeon: John Knows, MD;  Location: [facility];  Service: Orthopedics;  Laterality: Left;   TONSILLECTOMY  1949    Family History Family History  Problem Relation Age of Onset    Colon cancer Father 29   Dementia Mother    Esophageal cancer Neg Hx    Stomach cancer Neg Hx    Rectal cancer Neg Hx   No bleeding or clotting disorders  Social History Social History   Tobacco Use   Smoking status: Former Smoker    Packs/day: 1.00    Years: 20.00    Pack years: 20.00    Types: Cigarettes    Quit date: 06/20/1997    Years since quitting: 21.4   Smokeless tobacco: Never Used  Substance Use Topics   Alcohol use: Yes    Alcohol/week: 14.0 standard drinks    Types: 14 Glasses of wine per week    Comment: 1-2/day   Drug use: No    Allergies  Allergen Reactions   Vasotec [Enalapril Maleate] Cough    Current Outpatient Medications  Medication Sig Dispense Refill   cholecalciferol (VITAMIN D3) 25 MCG (1000 UT) tablet Take 1,000 Units by mouth daily.     doxazosin (CARDURA) 2 MG tablet Take 8 mg by mouth daily.      Multiple Vitamins-Minerals (ICAPS AREDS 2 PO) Take 1 capsule by mouth daily.      sildenafil (REVATIO) 20 MG tablet Take 40-60 mg by mouth daily as needed.     telmisartan (MICARDIS) 80 MG tablet Take 80 mg by mouth daily.     triamcinolone cream (KENALOG)  0.1 % Apply 1 application topically daily.     finasteride (PROSCAR) 5 MG tablet Take 5 mg by mouth daily.     ketoconazole (NIZORAL) 2 % shampoo Apply 1 application topically 2 (two) times a week.     No current facility-administered medications for this visit.       REVIEW OF SYSTEMS (Negative unless checked)  Constitutional: [] Weight loss  [] Fever  [] Chills Cardiac: [] Chest pain   [] Chest pressure   [] Palpitations   [] Shortness of breath when laying flat   [] Shortness of breath at rest   [] Shortness of breath with exertion. Vascular:  [] Pain in legs with walking   [] Pain in legs at rest   [] Pain in legs when laying flat   [] Claudication   [] Pain in feet when walking  [] Pain in feet at rest  [] Pain in feet when laying flat   [] History of DVT   [] Phlebitis   [x] Swelling in  legs   [] Varicose veins   [] Non-healing ulcers Pulmonary:   [] Uses home oxygen   [] Productive cough   [] Hemoptysis   [] Wheeze  [] COPD   [] Asthma Neurologic:  [] Dizziness  [] Blackouts   [] Seizures   [] History of stroke   [] History of TIA  [] Aphasia   [] Temporary blindness   [] Dysphagia   [] Weakness or numbness in arms   [] Weakness or numbness in legs Musculoskeletal:  [] Arthritis   [] Joint swelling   [] Joint pain   [] Low back pain Hematologic:  [] Easy bruising  [] Easy bleeding   [] Hypercoagulable state   [] Anemic  [] Hepatitis Gastrointestinal:  [] Blood in stool   [] Vomiting blood  [x] Gastroesophageal reflux/heartburn   [] Abdominal pain Genitourinary:  [] Chronic kidney disease   [] Difficult urination  [] Frequent urination  [] Burning with urination   [] Hematuria Skin:  [] Rashes   [] Ulcers   [] Wounds Psychological:  [] History of anxiety   []  History of major depression.    Physical Exam BP (!) 144/74 (BP Location: Left Arm, Patient Position: Sitting, Cuff Size: Normal)    Pulse 77    Resp 12    Ht 5\' 7"  (1.702 m)    Wt 165 lb (74.8 kg)    BMI 25.84 kg/m  Gen:  WD/WN, NAD.  Appears younger than stated age Head: [place]/AT, No temporalis wasting. Ear/Nose/Throat: Hearing grossly intact, nares w/o erythema or drainage, oropharynx w/o Erythema/Exudate Eyes: Conjunctiva clear, sclera non-icteric  Neck: trachea midline.  No JVD.  Pulmonary:  Good air movement, respirations not labored, no use of accessory muscles  Cardiac: RRR, no JVD Vascular:  Vessel Right Left  Radial Palpable Palpable                          DP 1+ 1+   PT Trace  Trace     Musculoskeletal: M/S 5/5 throughout.  Extremities without ischemic changes.  No deformity or atrophy. 1-2+ BLE edema. Neurologic: Sensation grossly intact in extremities.  Symmetrical.  Speech is fluent. Motor exam as listed above. Psychiatric: Judgment intact, Mood & affect appropriate for pt's clinical situation. Dermatologic: No rashes or ulcers  noted.  No cellulitis or open wounds.    Radiology Mr Prostate W Wo Contrast  Result Date: 11/27/2018 CLINICAL DATA:  Prostatomegaly with elevated PSA level of 6.80 on 10/18/2018. 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 EXAM: MR PROSTATE WITHOUT AND WITH CONTRAST TECHNIQUE: Multiplanar multisequence MRI images were obtained of the pelvis centered about the prostate. Pre and post contrast  images were obtained. CONTRAST:  78mL MULTIHANCE GADOBENATE DIMEGLUMINE 529 MG/ML IV SOLN Creatinine was obtained on site at [place] at [address] Results: Creatinine 0.9 mg/dL. COMPARISON:  CT pelvis 02/01/2017. FINDINGS: Prostate: Region of interest # 1: PI-RADS category 4 left apical posterolateral and to a lesser extent posteromedial peripheral zone lesion with reduced T2 and mildly reduced ADC map activity, faintly increased high B value diffusion weighted activity, and low-grade early enhancement. This lesion measures 0.72 cubic cm (1.9 by 0.8 by 0.9 cm). Prominence of the central gland compatible with benign prostatic hypertrophy. Volume: 3D volumetric analysis: 106.98 cubic cm (6.5 by 5.7 by 6.2 cm). Transcapsular spread:  Absent Seminal vesicle involvement: Absent Neurovascular bundle involvement: Absent Pelvic adenopathy: Absent Bone metastasis: Absent Other findings: Left inguinal hernia contains adipose tissue. IMPRESSION: 1. PI-RADS category 4 lesion primarily in the left apically posterolateral peripheral zone and to a lesser extent extending into the posteromedial peripheral zone, measuring 0.72 cubic cm. Targeting data sent to [place]. 2. Benign prostatic hypertrophy. 3. Prostate volume: Approximately 107 cubic cm. 4. Left inguinal hernia contains adipose tissue. Electronically Signed   By: Van Clines M.D.   On: 11/27/2018 14:02    Labs No results found for this or any previous visit (from the past  2160 hour(s)).  Assessment/Plan:  Essential hypertension blood pressure control important in reducing the progression of atherosclerotic disease. On appropriate oral medications.  The amlodipine could have been a contributing factor to his leg swelling, and even after discontinuation sometimes this persists   Hyponatremia Has been on sodium replacement therapy.  Electrolyte imbalances could be contributing to his lower extremity swelling.  Swelling of limb I have had a long discussion with the patient regarding swelling and why it  causes symptoms.  Patient will begin wearing graduated compression stockings class 1 (20-30 mmHg) on a daily basis a prescription was given. The patient will  beginning wearing the stockings first thing in the morning and removing them in the evening. The patient is instructed specifically not to sleep in the stockings.   In addition, behavioral modification will be initiated.  This will include frequent elevation, use of over the counter pain medications and exercise such as walking.  I have reviewed systemic causes for chronic edema such as liver, kidney and cardiac etiologies.  The patient denies problems with these organ systems.    Consideration for a lymph pump will also be made based upon the effectiveness of conservative therapy.  This would help to improve the edema control and prevent sequela such as ulcers and infections   Patient should undergo duplex ultrasound of the venous system to ensure that DVT or reflux is not present.  The patient will follow-up with me after the ultrasound.        Leotis Pain 12/11/2018, 10:31 AM   This note was created with Dragon medical transcription system.  Any errors from dictation are unintentional.

## 2018-12-11 NOTE — Assessment & Plan Note (Signed)
Has been on sodium replacement therapy.  Electrolyte imbalances could be contributing to his lower extremity swelling.

## 2018-12-11 NOTE — Assessment & Plan Note (Signed)

## 2018-12-11 NOTE — Patient Instructions (Signed)

## 2018-12-11 NOTE — Assessment & Plan Note (Signed)
blood pressure control important in reducing the progression of atherosclerotic disease. On appropriate oral medications.  The amlodipine could have been a contributing factor to his leg swelling, and even after discontinuation sometimes this persists

## 2019-01-29 ENCOUNTER — Encounter (INDEPENDENT_AMBULATORY_CARE_PROVIDER_SITE_OTHER): Payer: Self-pay | Admitting: Vascular Surgery

## 2019-01-29 ENCOUNTER — Ambulatory Visit (INDEPENDENT_AMBULATORY_CARE_PROVIDER_SITE_OTHER): Payer: Medicare Other | Admitting: Vascular Surgery

## 2019-01-29 ENCOUNTER — Ambulatory Visit (INDEPENDENT_AMBULATORY_CARE_PROVIDER_SITE_OTHER): Payer: Medicare Other

## 2019-01-29 ENCOUNTER — Encounter (INDEPENDENT_AMBULATORY_CARE_PROVIDER_SITE_OTHER): Payer: Self-pay

## 2019-01-29 ENCOUNTER — Other Ambulatory Visit: Payer: Self-pay

## 2019-01-29 VITALS — BP 146/83 | HR 73 | Resp 10 | Ht 67.0 in | Wt 164.0 lb

## 2019-01-29 DIAGNOSIS — I83891 Varicose veins of right lower extremities with other complications: Secondary | ICD-10-CM | POA: Insufficient documentation

## 2019-01-29 DIAGNOSIS — I10 Essential (primary) hypertension: Secondary | ICD-10-CM | POA: Diagnosis not present

## 2019-01-29 DIAGNOSIS — E871 Hypo-osmolality and hyponatremia: Secondary | ICD-10-CM

## 2019-01-29 DIAGNOSIS — M7989 Other specified soft tissue disorders: Secondary | ICD-10-CM

## 2019-01-29 NOTE — Progress Notes (Signed)
MRN : [id number]  John Long. is a 78 y.o. (07-03-1940) male who presents with chief complaint of  Chief Complaint  Patient presents with  . Follow-up  .  History of Present Illness: Patient returns today in follow up of his leg swelling.  He has been diligently wearing 20 to 30 mmHg compression stockings, elevating his legs, and maintaining normal activity for several weeks now.  This has resulted in significant improvement in his lower extremity pain and swelling. Reflux study today demonstrated no evidence of DVT or superficial thrombophlebitis.  Baker's cyst were present bilaterally.  Venous reflux was identified in the right common femoral vein, superficial femoral vein, great saphenous vein, and small saphenous vein with only reflux in the left common femoral vein and saphenofemoral junction seen.  Current Outpatient Medications  Medication Sig Dispense Refill  . cholecalciferol (VITAMIN D3) 25 MCG (1000 UT) tablet Take 1,000 Units by mouth daily.    Marland Kitchen doxazosin (CARDURA) 2 MG tablet Take 8 mg by mouth daily.     . Multiple Vitamins-Minerals (ICAPS AREDS 2 PO) Take 1 capsule by mouth daily.     . sildenafil (REVATIO) 20 MG tablet Take 40-60 mg by mouth daily as needed.    Marland Kitchen telmisartan (MICARDIS) 80 MG tablet Take 80 mg by mouth daily.    Marland Kitchen triamcinolone cream (KENALOG) 0.1 % Apply 1 application topically daily.    . finasteride (PROSCAR) 5 MG tablet Take 5 mg by mouth daily.    Marland Kitchen ketoconazole (NIZORAL) 2 % shampoo Apply 1 application topically 2 (two) times a week.     No current facility-administered medications for this visit.     Past Medical History:  Diagnosis Date  . Allergy   . Enlarged prostate   . Family history of colon cancer   . GERD (gastroesophageal reflux disease)   . Hypertension     Past Surgical History:  Procedure Laterality Date  . BLEPHAROPLASTY Bilateral   . COLONOSCOPY WITH PROPOFOL    . HERNIA REPAIR Bilateral 07/24/2017  . INGUINAL  HERNIA REPAIR Left 09/05/2018   Medium Bard Perfix Plug; RECURRENT HERNIA REPAIR INGUINAL ADULT-LEFT;  Surgeon: Robert Bellow, MD;  Location: [facility];  Service: General;  Laterality: Left;  . OPEN REDUCTION INTERNAL FIXATION (ORIF) DISTAL RADIAL FRACTURE Left 02/08/2018   Procedure: OPEN REDUCTION INTERNAL FIXATION (ORIF) DISTAL RADIAL FRACTURE;  Surgeon: Hessie Knows, MD;  Location: [facility];  Service: Orthopedics;  Laterality: Left;  . TONSILLECTOMY  1949    Social History Social History   Tobacco Use  . Smoking status: Former Smoker    Packs/day: 1.00    Years: 20.00    Pack years: 20.00    Types: Cigarettes    Quit date: 06/20/1997    Years since quitting: 21.6  . Smokeless tobacco: Never Used  Substance Use Topics  . Alcohol use: Yes    Alcohol/week: 14.0 standard drinks    Types: 14 Glasses of wine per week    Comment: 1-2/day  . Drug use: No    Family History Family History  Problem Relation Age of Onset  . Colon cancer Father 44  . Dementia Mother   . Esophageal cancer Neg Hx   . Stomach cancer Neg Hx   . Rectal cancer Neg Hx     Allergies  Allergen Reactions  . Vasotec [Enalapril Maleate] Cough     REVIEW OF SYSTEMS (Negative unless checked)  Constitutional: [] Weight loss  [] Fever  [] Chills Cardiac: [] Chest  pain   [] Chest pressure   [] Palpitations   [] Shortness of breath when laying flat   [] Shortness of breath at rest   [] Shortness of breath with exertion. Vascular:  [] Pain in legs with walking   [] Pain in legs at rest   [] Pain in legs when laying flat   [] Claudication   [] Pain in feet when walking  [] Pain in feet at rest  [] Pain in feet when laying flat   [] History of DVT   [] Phlebitis   [x] Swelling in legs   [] Varicose veins   [] Non-healing ulcers Pulmonary:   [] Uses home oxygen   [] Productive cough   [] Hemoptysis   [] Wheeze  [] COPD   [] Asthma Neurologic:  [] Dizziness  [] Blackouts   [] Seizures   [] History of stroke   [] History of TIA  [] Aphasia    [] Temporary blindness   [] Dysphagia   [] Weakness or numbness in arms   [] Weakness or numbness in legs Musculoskeletal:  [] Arthritis   [] Joint swelling   [] Joint pain   [] Low back pain Hematologic:  [] Easy bruising  [] Easy bleeding   [] Hypercoagulable state   [] Anemic   Gastrointestinal:  [] Blood in stool   [] Vomiting blood  [x] Gastroesophageal reflux/heartburn   [] Abdominal pain Genitourinary:  [] Chronic kidney disease   [] Difficult urination  [] Frequent urination  [] Burning with urination   [] Hematuria Skin:  [] Rashes   [] Ulcers   [] Wounds Psychological:  [] History of anxiety   []  History of major depression.  Physical Examination  BP (!) 146/83 (BP Location: Left Arm, Patient Position: Sitting, Cuff Size: Normal)   Pulse 73   Resp 10   Ht 5\' 7"  (1.702 m)   Wt 164 lb (74.4 kg)   BMI 25.69 kg/m  Gen:  WD/WN, NAD.  Appears younger than stated age Head: [place]/AT, No temporalis wasting. Ear/Nose/Throat: Hearing grossly intact, nares w/o erythema or drainage Eyes: Conjunctiva clear. Sclera non-icteric Neck: Supple.  Trachea midline Pulmonary:  Good air movement, no use of accessory muscles.  Cardiac: RRR, no JVD Vascular:  Vessel Right Left  Radial Palpable Palpable                          PT  1+ palpable Palpable  DP Palpable Palpable   Gastrointestinal: soft, non-tender/non-distended. No guarding/reflex.  Musculoskeletal: M/S 5/5 throughout.  No deformity or atrophy.  1+ right lower extremity edema, trace left lower extremity edema. Neurologic: Sensation grossly intact in extremities.  Symmetrical.  Speech is fluent.  Psychiatric: Judgment intact, Mood & affect appropriate for pt's clinical situation. Dermatologic: No rashes or ulcers noted.  No cellulitis or open wounds.       Labs No results found for this or any previous visit (from the past 2160 hour(s)).  Radiology No results found.  Assessment/Plan Essential hypertension blood pressure control important in  reducing the progression of atherosclerotic disease. On appropriate oral medications.  The amlodipine could have been a contributing factor to his leg swelling, and even after discontinuation sometimes this persists   Hyponatremia Has been on sodium replacement therapy.  Electrolyte imbalances could be contributing to his lower extremity swelling.  Swelling of limb Significantly improved with compression stockings and elevation  Varicose veins of leg with swelling, right Reflux study today demonstrated no evidence of DVT or superficial thrombophlebitis.  Baker's cyst were present bilaterally.  Venous reflux was identified in the right common femoral vein, superficial femoral vein, great saphenous vein, and small saphenous vein with only reflux in the left common femoral vein  and saphenofemoral junction seen. Symptom improvement has been quite significant with compression stockings and elevation.  We discussed options and at this point proceeding with intervention does not seem to be necessary.  If he has worsening symptoms with conservative therapy, that would be planned in the future.  14-month follow-up    Leotis Pain, MD  01/29/2019 3:54 PM    This note was created with Dragon medical transcription system.  Any errors from dictation are purely unintentional

## 2019-01-29 NOTE — Assessment & Plan Note (Signed)
Significantly improved with compression stockings and elevation

## 2019-01-29 NOTE — Assessment & Plan Note (Signed)
Reflux study today demonstrated no evidence of DVT or superficial thrombophlebitis.  Baker's cyst were present bilaterally.  Venous reflux was identified in the right common femoral vein, superficial femoral vein, great saphenous vein, and small saphenous vein with only reflux in the left common femoral vein and saphenofemoral junction seen. Symptom improvement has been quite significant with compression stockings and elevation.  We discussed options and at this point proceeding with intervention does not seem to be necessary.  If he has worsening symptoms with conservative therapy, that would be planned in the future.  28-month follow-up

## 2019-02-01 ENCOUNTER — Ambulatory Visit: Payer: Medicare Other | Admitting: Gastroenterology

## 2019-02-01 ENCOUNTER — Encounter: Payer: Self-pay | Admitting: Gastroenterology

## 2019-02-01 ENCOUNTER — Ambulatory Visit (INDEPENDENT_AMBULATORY_CARE_PROVIDER_SITE_OTHER): Payer: Medicare Other | Admitting: Gastroenterology

## 2019-02-01 DIAGNOSIS — K219 Gastro-esophageal reflux disease without esophagitis: Secondary | ICD-10-CM

## 2019-02-01 DIAGNOSIS — R1310 Dysphagia, unspecified: Secondary | ICD-10-CM

## 2019-02-01 DIAGNOSIS — R1319 Other dysphagia: Secondary | ICD-10-CM

## 2019-02-01 DIAGNOSIS — Z86010 Personal history of colonic polyps: Secondary | ICD-10-CM

## 2019-02-01 NOTE — Progress Notes (Signed)
This patient contacted our office requesting a physician telemedicine consultation regarding clinical questions and/or test results. Due to COVID restrictions, this was felt to be the most appropriate method of patient evaluation.  Participants on the conference : myself and patient   The patient consented to this consultation and was aware that a charge will be placed through their insurance.  They were also made aware of the limitations of telemedicine.  I was in my office and the patient was at home.  Visit was conducted entirely on [employer]   _____________________________________________________________________________________________                   [facility] Consult Note:  History: John Long. 02/01/2019  Referring provider: Hulan Fess, MD  Reason for consult/chief complaint: No chief complaint on file.   Subjective  HPI: Colonoscopy by Dr. Sharlett Long December 2014 for family history of colon cancer in father.  Diminutive cecal adenoma removed.   This is a very pleasant 78 year old man self-referred to discuss longstanding reflux.  He reports heartburn and regurgitation intermittently since adolescence.  He has not previously had endoscopic evaluation.  John Long took omeprazole daily from about 2001 until 2012, when a physician at [facility] suggested he stop it out of concern for potential long-term side effects.  Since then he has been fairly well, having heartburn on average about twice a week, and usually with certain food triggers, and he takes Tums with relief.  He is able to swallow well, but at times feel that food is slow to pass through the lower esophagus.  His appetite is good and weight stable.  He denies nausea, vomiting, early satiety or weight loss.  Bowel habits are regular without rectal bleeding.   ROS:  Review of Systems  Constitutional: Negative for appetite change and unexpected weight change.  HENT: Negative  for mouth sores and voice change.   Eyes: Negative for pain and redness.  Respiratory: Negative for cough and shortness of breath.   Cardiovascular: Negative for chest pain and palpitations.  Genitourinary: Negative for dysuria and hematuria.  Musculoskeletal: Negative for arthralgias and myalgias.  Skin: Negative for pallor and rash.  Neurological: Negative for weakness and headaches.  Hematological: Negative for adenopathy.     Past Medical History: Past Medical History:  Diagnosis Date  . Allergy   . Enlarged prostate   . Family history of colon cancer   . GERD (gastroesophageal reflux disease)   . Hypertension      Past Surgical History: Past Surgical History:  Procedure Laterality Date  . BLEPHAROPLASTY Bilateral   . COLONOSCOPY WITH PROPOFOL    . HERNIA REPAIR Bilateral 07/24/2017  . INGUINAL HERNIA REPAIR Left 09/05/2018   Medium Bard Perfix Plug; RECURRENT HERNIA REPAIR INGUINAL ADULT-LEFT;  Surgeon: John Bellow, MD;  Location: [facility] ORS;  Service: General;  Laterality: Left;  . OPEN REDUCTION INTERNAL FIXATION (ORIF) DISTAL RADIAL FRACTURE Left 02/08/2018   Procedure: OPEN REDUCTION INTERNAL FIXATION (ORIF) DISTAL RADIAL FRACTURE;  Surgeon: John Knows, MD;  Location: [facility] ORS;  Service: Orthopedics;  Laterality: Left;  . TONSILLECTOMY  1949     Family History: Family History  Problem Relation Age of Onset  . Colon cancer Father 62  . Dementia Mother   . Esophageal cancer Neg Hx   . Stomach cancer Neg Hx   . Rectal cancer Neg Hx     Social History: Social History   Socioeconomic History  . Marital status: Married    Spouse name:  Not on file  . Number of children: Not on file  . Years of education: Not on file  . Highest education level: Not on file  Occupational History  . Not on file  Social Needs  . Financial resource strain: Not on file  . Food insecurity    Worry: Not on file    Inability: Not on file  . Transportation needs     Medical: Not on file    Non-medical: Not on file  Tobacco Use  . Smoking status: Former Smoker    Packs/day: 1.00    Years: 20.00    Pack years: 20.00    Types: Cigarettes    Quit date: 06/20/1997    Years since quitting: 21.6  . Smokeless tobacco: Never Used  Substance and Sexual Activity  . Alcohol use: Yes    Alcohol/week: 14.0 standard drinks    Types: 14 Glasses of wine per week    Comment: 1-2/day  . Drug use: No  . Sexual activity: Yes  Lifestyle  . Physical activity    Days per week: Not on file    Minutes per session: Not on file  . Stress: Not on file  Relationships  . Social Herbalist on phone: Not on file    Gets together: Not on file    Attends religious service: Not on file    Active member of club or organization: Not on file    Attends meetings of clubs or organizations: Not on file    Relationship status: Not on file  Other Topics Concern  . Not on file  Social History Narrative  . Not on file    Allergies: Allergies  Allergen Reactions  . Vasotec [Enalapril Maleate] Cough    Outpatient Meds: Current Outpatient Medications  Medication Sig Dispense Refill  . cholecalciferol (VITAMIN D3) 25 MCG (1000 UT) tablet Take 1,000 Units by mouth daily.    Marland Kitchen doxazosin (CARDURA) 2 MG tablet Take 8 mg by mouth daily.     . finasteride (PROSCAR) 5 MG tablet Take 5 mg by mouth daily.    Marland Kitchen ketoconazole (NIZORAL) 2 % shampoo Apply 1 application topically 2 (two) times a week.    . Multiple Vitamins-Minerals (ICAPS AREDS 2 PO) Take 1 capsule by mouth daily.     . sildenafil (REVATIO) 20 MG tablet Take 40-60 mg by mouth daily as needed.    Marland Kitchen telmisartan (MICARDIS) 80 MG tablet Take 80 mg by mouth daily.    Marland Kitchen triamcinolone cream (KENALOG) 0.1 % Apply 1 application topically daily.     No current facility-administered medications for this visit.       ___________________________________________________________________ Objective   Exam:   No  exam-virtual visit  He is well-appearing with normal vocal quality    Assessment: Encounter Diagnoses  Name Primary?  . Gastroesophageal reflux disease, esophagitis presence not specified Yes  . Esophageal dysphagia   . History of colonic polyps     Longstanding reflux symptoms.  Currently intermittent heartburn with food triggers, seems to be well-controlled with as needed use of antacids.  He was concerned about the possibility of long-term problems having developed from the reflux.  We discussed the possibilities including Barrett's esophagus, reflux esophagitis, as well as stricture given his reported symptoms.  I think an upper endoscopy is reasonable for Barrett screening and to investigate his symptoms. He is also due for surveillance colonoscopy with history of colon polyp, first-degree relative with colon cancer, and he is  in good health for his age and appropriate for surveillance. Plan:  EGD and colonoscopy in our endoscopy lab.  He is agreeable after discussion of procedure and risks.  The benefits and risks of the planned procedure were described in detail with the patient or (when appropriate) their health care proxy.  Risks were outlined as including, but not limited to, bleeding, infection, perforation, adverse medication reaction leading to cardiac or pulmonary decompensation.  The limitation of incomplete mucosal visualization was also discussed.  No guarantees or warranties were given.   Thank you for the courtesy of this consult.  Please call me with any questions or concerns.  Nelida Meuse III  CC: Referring provider noted above

## 2019-02-04 ENCOUNTER — Other Ambulatory Visit: Payer: Self-pay

## 2019-02-04 DIAGNOSIS — K219 Gastro-esophageal reflux disease without esophagitis: Secondary | ICD-10-CM

## 2019-02-04 DIAGNOSIS — Z86010 Personal history of colonic polyps: Secondary | ICD-10-CM

## 2019-02-04 DIAGNOSIS — R1319 Other dysphagia: Secondary | ICD-10-CM

## 2019-02-04 DIAGNOSIS — R1310 Dysphagia, unspecified: Secondary | ICD-10-CM

## 2019-02-04 MED ORDER — NA SULFATE-K SULFATE-MG SULF 17.5-3.13-1.6 GM/177ML PO SOLN: 1.0000 | Freq: Once | ORAL | 0 refills | Status: DC

## 2019-02-04 NOTE — Patient Instructions (Signed)
If you are age 77 or older, your body mass index should be between 23-30. Your There is no height or weight on file to calculate BMI. If this is out of the aforementioned range listed, please consider follow up with your Primary Care Provider.  If you are age 55 or younger, your body mass index should be between 19-25. Your There is no height or weight on file to calculate BMI. If this is out of the aformentioned range listed, please consider follow up with your Primary Care Provider.   You have been scheduled for an endoscopy and colonoscopy. Please follow the written instructions given to you at your visit today. Please pick up your prep supplies at the pharmacy within the next 1-3 days. If you use inhalers (even only as needed), please bring them with you on the day of your procedure.  It was a pleasure to see you today!  Dr. Loletha Carrow

## 2019-02-07 ENCOUNTER — Other Ambulatory Visit: Payer: Self-pay | Admitting: Physician Assistant

## 2019-02-07 DIAGNOSIS — S6981XD Other specified injuries of right wrist, hand and finger(s), subsequent encounter: Secondary | ICD-10-CM

## 2019-02-19 ENCOUNTER — Other Ambulatory Visit: Payer: Self-pay

## 2019-02-19 ENCOUNTER — Ambulatory Visit
Admission: RE | Admit: 2019-02-19 | Discharge: 2019-02-19 | Disposition: A | Discharge status: Home / Self Care | Payer: Medicare Other | Source: Ambulatory Visit | Attending: Physician Assistant | Admitting: Physician Assistant

## 2019-02-19 DIAGNOSIS — S6981XD Other specified injuries of right wrist, hand and finger(s), subsequent encounter: Secondary | ICD-10-CM | POA: Diagnosis present

## 2019-02-19 IMAGING — MR MR WRIST*R* W/O CM
6 series · 40 of 40 positions shown · non-contrast
Comparison: None.

CLINICAL DATA: Dorsal wrist pain and swelling for the past 2-3
months. No known injury.

EXAM:
MR OF THE RIGHT WRIST WITHOUT CONTRAST
TECHNIQUE: Multiplanar, multisequence MR imaging of the right wrist was
performed. No intravenous contrast was administered.

[Series 3: T2 fat-sat · axial · right · 2.0mm · 0.47mm/px · z∈[-75,+8]mm · 10 of 40 slices shown (1 of 2)]
[im 1/40]
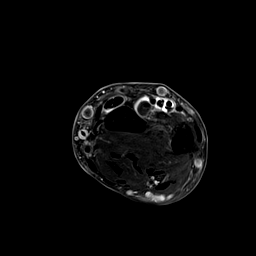
[im 5/40]
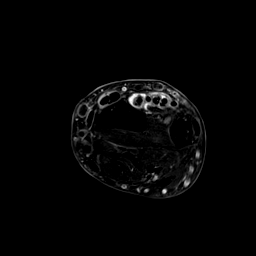
[im 9/40]
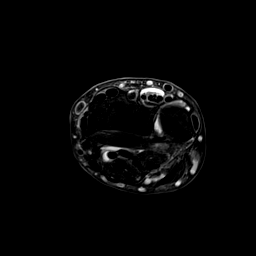
[im 14/40]
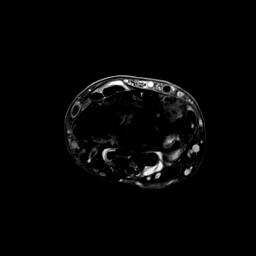
[im 18/40]
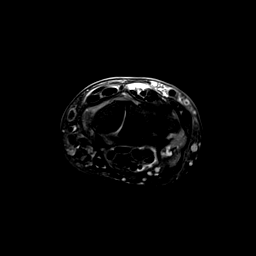
[im 22/40]
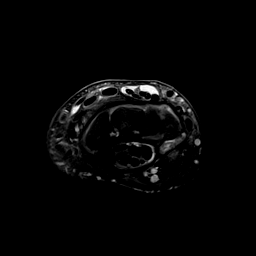
[im 27/40]
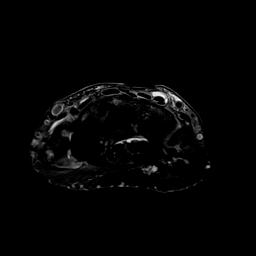
[im 31/40]
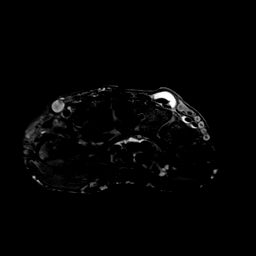
[im 35/40]
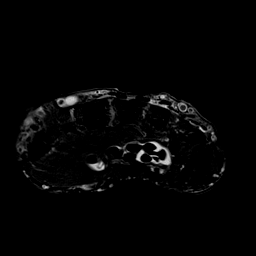
[im 40/40]
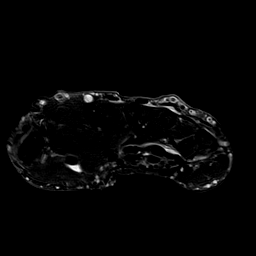

[Series 4: T1 · axial · right · 2.0mm · 0.47mm/px · z∈[-75,+8]mm · 10 of 40 slices shown (1 of 2)]
[im 1/40]
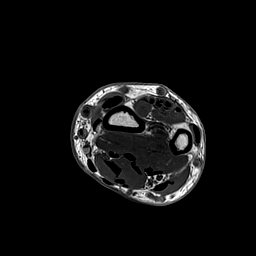
[im 5/40]
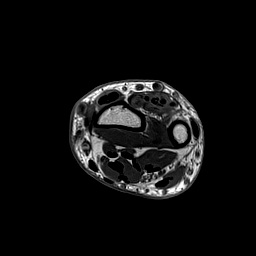
[im 9/40]
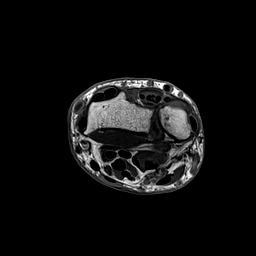
[im 14/40]
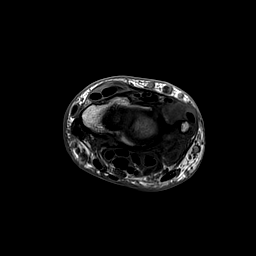
[im 18/40]
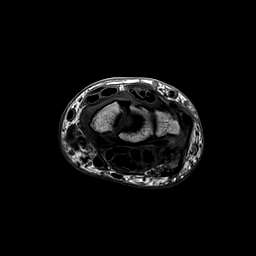
[im 22/40]
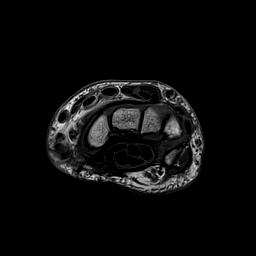
[im 27/40]
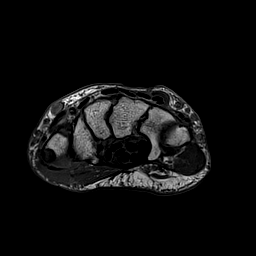
[im 31/40]
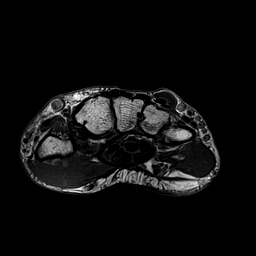
[im 35/40]
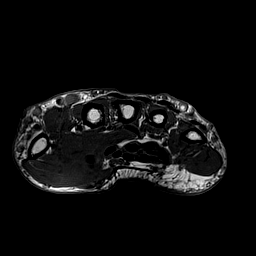
[im 40/40]
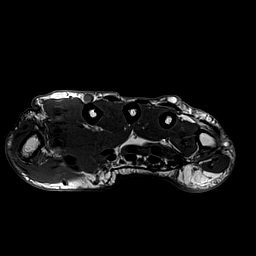

[Series 5: T1 · coronal · right · 3.0mm · 0.45mm/px · 4 of 17 slices shown (2 of 2)]
[im 1/17]
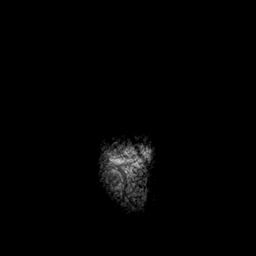
[im 6/17]
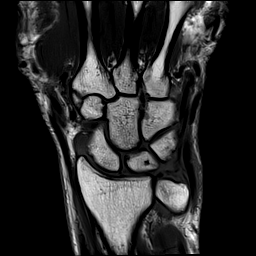
[im 11/17]
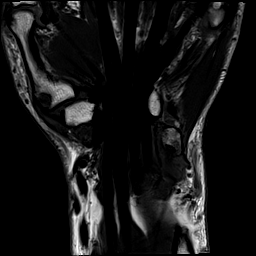
[im 17/17]
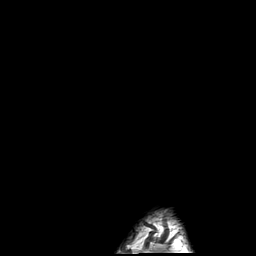

[Series 6: T2 fat-sat · coronal · right · 3.0mm · 0.43mm/px · 4 of 17 slices shown (2 of 2)]
[im 1/17]
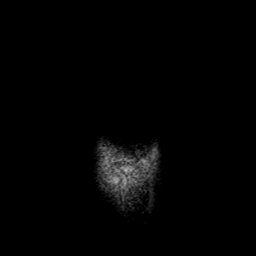
[im 6/17]
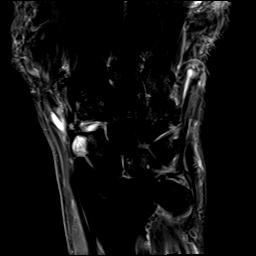
[im 11/17]
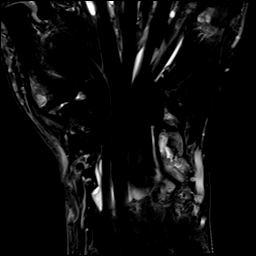
[im 17/17]
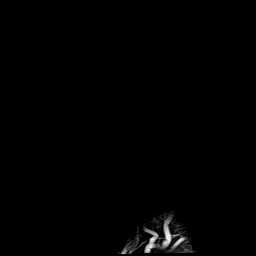

[Series 7: PD fat-sat · coronal · right · 3.0mm · 0.43mm/px · 4 of 17 slices shown (1 of 2)]
[im 1/17]
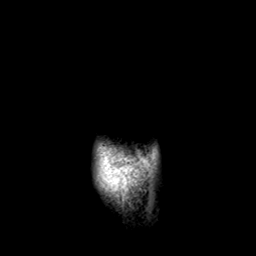
[im 6/17]
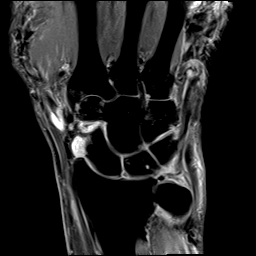
[im 11/17]
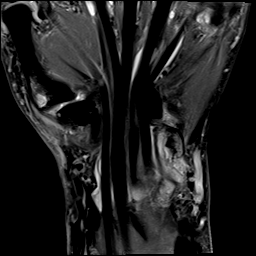
[im 17/17]
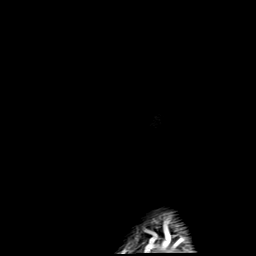

[Series 8: PD fat-sat · sagittal · right · 3.0mm · 0.39mm/px · 8 of 31 slices shown (2 of 2)]
[im 1/31]
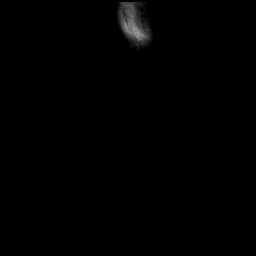
[im 5/31]
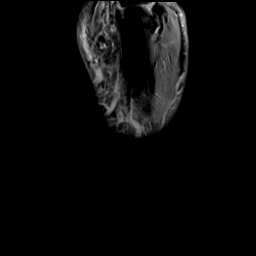
[im 9/31]
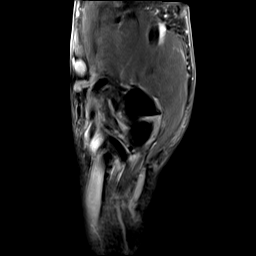
[im 13/31]
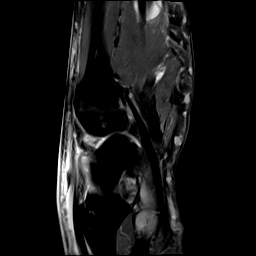
[im 18/31]
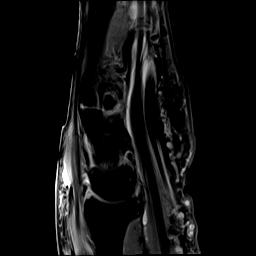
[im 22/31]
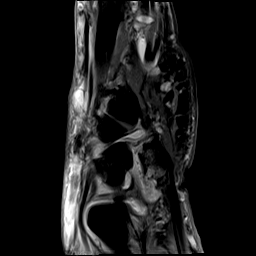
[im 26/31]
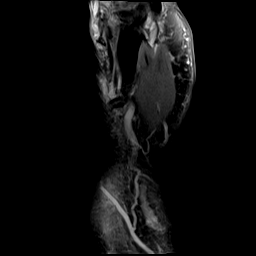
[im 31/31]
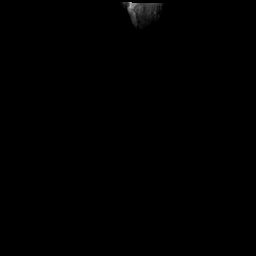

[40 of 40 positions shown; findings below may reference images not displayed]

FINDINGS: Ligaments: Small full-thickness tear involving the central component
of the scapholunate ligament (series 6, image 7). Intact
lunotriquetral ligament.

Triangular fibrocartilage: Small partial tear involving the dorsal
radioulnar ligament of the TFCC.

Tendons: Tenosynovitis involving all extensor tendon compartments
and all finger flexor tendons. The flexor and extensor tendons are
intact.

Carpal tunnel/median nerve: Normal carpal tunnel. Normal median
nerve.

Guyon's canal: Normal.

Joint/cartilage: Scattered small erosions involving the carpal bones
and base of the first metacarpal. Radiocarpal and midcarpal
synovitis. First CMC joint synovitis. Small distal radioulnar joint
effusion with synovitis.

Bones/carpal alignment: No acute fracture or dislocation. Normal
alignment. No suspicious bone lesion.

Other: None.
IMPRESSION: 1. Diffuse flexor and extensor tendon compartment tenosynovitis with
radiocarpal, midcarpal, and distal radioulnar joint synovitis.
Scattered small erosions involving the carpal bones and base of the
first metacarpal. Findings are consistent with inflammatory
arthropathy.
2. Small full-thickness tear involving the central component of the
scapholunate ligament.
3. Small partial tear involving the dorsal radioulnar ligament of
the TFCC.

## 2019-03-11 ENCOUNTER — Telehealth: Payer: Self-pay | Admitting: Gastroenterology

## 2019-03-11 ENCOUNTER — Other Ambulatory Visit: Payer: Self-pay

## 2019-03-11 ENCOUNTER — Encounter: Payer: Self-pay | Admitting: Gastroenterology

## 2019-03-11 MED ORDER — NA SULFATE-K SULFATE-MG SULF 17.5-3.13-1.6 GM/177ML PO SOLN: 1.0000 | Freq: Once | ORAL | 0 refills | Status: AC

## 2019-03-11 NOTE — Telephone Encounter (Signed)
Done

## 2019-03-19 ENCOUNTER — Telehealth: Payer: Self-pay

## 2019-03-19 NOTE — Telephone Encounter (Signed)
Covid-19 screening questions   Do you now or have you had a fever in the last 14 days? NO   Do you have any respiratory symptoms of shortness of breath or cough now or in the last 14 days? NO  Do you have any family members or close contacts with diagnosed or suspected Covid-19 in the past 14 days? NO  Have you been tested for Covid-19 and found to be positive? NO        

## 2019-03-20 ENCOUNTER — Other Ambulatory Visit: Payer: Self-pay

## 2019-03-20 ENCOUNTER — Encounter: Payer: Self-pay | Admitting: Gastroenterology

## 2019-03-20 ENCOUNTER — Ambulatory Visit (AMBULATORY_SURGERY_CENTER): Payer: Medicare Other | Admitting: Gastroenterology

## 2019-03-20 VITALS — BP 159/92 | HR 63 | Temp 98.2°F | Resp 11 | Ht 67.0 in | Wt 165.0 lb

## 2019-03-20 DIAGNOSIS — K6289 Other specified diseases of anus and rectum: Secondary | ICD-10-CM | POA: Diagnosis not present

## 2019-03-20 DIAGNOSIS — D120 Benign neoplasm of cecum: Secondary | ICD-10-CM | POA: Diagnosis not present

## 2019-03-20 DIAGNOSIS — Z86010 Personal history of colonic polyps: Secondary | ICD-10-CM

## 2019-03-20 DIAGNOSIS — K219 Gastro-esophageal reflux disease without esophagitis: Secondary | ICD-10-CM

## 2019-03-20 DIAGNOSIS — K222 Esophageal obstruction: Secondary | ICD-10-CM

## 2019-03-20 DIAGNOSIS — R1319 Other dysphagia: Secondary | ICD-10-CM

## 2019-03-20 DIAGNOSIS — R1310 Dysphagia, unspecified: Secondary | ICD-10-CM | POA: Diagnosis not present

## 2019-03-20 MED ORDER — SODIUM CHLORIDE 0.9 % IV SOLN: 500.0000 mL | Freq: Once | INTRAVENOUS | Status: DC

## 2019-03-20 NOTE — Patient Instructions (Addendum)
Thank you for allowing [place] to care for you today!   No further colonoscopies based on current age guidelines.  CT scan to be scheduled by Dr Loletha Carrow' office.  They will call you to arrange appointment. Fill out instruction sheet  On how and when to drink contrast the CT scan.  Instruction sheet in bag with bottles of contrast.  Resume previous diet and medications today.  Return to your normal activities tomorrow.   YOU HAD AN ENDOSCOPIC PROCEDURE TODAY AT [facility]:   Refer to the procedure report that was given to you for any specific questions about what was found during the examination.  If the procedure report does not answer your questions, please call your gastroenterologist to clarify.  If you requested that your care partner not be given the details of your procedure findings, then the procedure report has been included in a sealed envelope for you to review at your convenience later.  YOU SHOULD EXPECT: Some feelings of bloating in the abdomen. Passage of more gas than usual.  Walking can help get rid of the air that was put into your GI tract during the procedure and reduce the bloating. If you had a lower endoscopy (such as a colonoscopy or flexible sigmoidoscopy) you may notice spotting of blood in your stool or on the toilet paper. If you underwent a bowel prep for your procedure, you may not have a normal bowel movement for a few days.  Please Note:  You might notice some irritation and congestion in your nose or some drainage.  This is from the oxygen used during your procedure.  There is no need for concern and it should clear up in a day or so.  SYMPTOMS TO REPORT IMMEDIATELY:   Following lower endoscopy (colonoscopy or flexible sigmoidoscopy):  Excessive amounts of blood in the stool  Significant tenderness or worsening of abdominal pains  Swelling of the abdomen that is new, acute  Fever of 100F or higher   Following upper endoscopy (EGD)  Vomiting of  blood or coffee ground material  New chest pain or pain under the shoulder blades  Painful or persistently difficult swallowing  New shortness of breath  Fever of 100F or higher  Black, tarry-looking stools  For urgent or emergent issues, a gastroenterologist can be reached at any hour by calling [phone].   DIET:  We do recommend a small meal at first, but then you may proceed to your regular diet.  Drink plenty of fluids but you should avoid alcoholic beverages for 24 hours.  ACTIVITY:  You should plan to take it easy for the rest of today and you should NOT DRIVE or use heavy machinery until tomorrow (because of the sedation medicines used during the test).    FOLLOW UP: Our staff will call the number listed on your records 48-72 hours following your procedure to check on you and address any questions or concerns that you may have regarding the information given to you following your procedure. If we do not reach you, we will leave a message.  We will attempt to reach you two times.  During this call, we will ask if you have developed any symptoms of COVID 19. If you develop any symptoms (ie: fever, flu-like symptoms, shortness of breath, cough etc.) before then, please call [phone].  If you test positive for Covid 19 in the 2 weeks post procedure, please call and report this information to [place].    If any  biopsies were taken you will be contacted by phone or by letter within the next 1-3 weeks.  Please call us at [phone] if you have not heard about the biopsies in 3 weeks.    SIGNATURES/CONFIDENTIALITY: You and/or your care partner have signed paperwork which will be entered into your electronic medical record.  These signatures attest to the fact that that the information above on your After Visit Summary has been reviewed and is understood.  Full responsibility of the confidentiality of this discharge information lies with you and/or your care-partner.

## 2019-03-20 NOTE — Op Note (Signed)
[place] Patient Name: John Long Procedure Date: 03/20/2019 2:08 PM MRN: [id number] Endoscopist: Mallie Mussel L. Loletha Carrow , MD Age: 78 Referring MD:  Date of Birth: 05/02/1941 Gender: Male Account #: [id number] Procedure:                Upper GI endoscopy Indications:              Esophageal dysphagia, Esophageal reflux symptoms                            that recur despite appropriate therapy Medicines:                Monitored Anesthesia Care Procedure:                Pre-Anesthesia Assessment:                           - Prior to the procedure, a History and Physical                            was performed, and patient medications and                            allergies were reviewed. The patient's tolerance of                            previous anesthesia was also reviewed. The risks                            and benefits of the procedure and the sedation                            options and risks were discussed with the patient.                            All questions were answered, and informed consent                            was obtained. Prior Anticoagulants: The patient has                            taken no previous anticoagulant or antiplatelet                            agents. ASA Grade Assessment: II - A patient with                            mild systemic disease. After reviewing the risks                            and benefits, the patient was deemed in                            satisfactory condition to undergo the procedure.  After obtaining informed consent, the endoscope was                            passed under direct vision. Throughout the                            procedure, the patient's blood pressure, pulse, and                            oxygen saturations were monitored continuously. The                            Endoscope was introduced through the mouth, and                            advanced to the  duodenal bulb. The upper GI                            endoscopy was somewhat difficult due to significant                            looping. Scope In: Scope Out: Findings:                 A large hiatal hernia was present. There appeared                            to be both sliding and paraesophageal component to                            ther hernia. This was causing tortuosity of the                            distal esophagus.                           One benign-appearing, intrinsic moderate stenosis                            was found at the gastroesophageal junction. This                            stenosis measured 1.4 cm (inner diameter). The                            stenosis was traversed. It was not amenable to                            dilation due to the hernia and tortuosity.                           The stomach was normal other than being involved in                            the hiatal hernia,  causing scope scope looping and                            inability to pass scope beyond duodenal bulb.                           The duodenal bulb was normal. The remainder of the                            duodenum could not be reached. Complications:            No immediate complications. Estimated Blood Loss:     Estimated blood loss: none. Impression:               - Large hiatal hernia.                           - Benign-appearing esophageal stenosis.                           - Normal stomach.                           - Normal duodenal bulb.                           - No specimens collected. Recommendation:           - Patient has a contact number available for                            emergencies. The signs and symptoms of potential                            delayed complications were discussed with the                            patient. Return to normal activities tomorrow.                            Written discharge instructions were provided to the                             patient.                           - Resume previous diet.                           - Continue present medications.                           - See the other procedure note for documentation of                            additional recommendations.                           -  Perform a CT scan (computed tomography) of chest                            without contrast and abdomen without contrast at                            appointment to be scheduled. Henry L. Loletha Carrow, MD 03/20/2019 2:50:55 PM This report has been signed electronically.

## 2019-03-20 NOTE — Op Note (Signed)
[facility] Patient Name: John Long Procedure Date: 03/20/2019 2:09 PM MRN: [id number] Endoscopist: Mallie Mussel L. 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 The patient's tolerance of                            previous anesthesia was also reviewed. The risks                            and benefits of the procedure and the sedation                            options and risks were discussed with the patient.                            All questions were answered, and informed consent                            was obtained. Prior Anticoagulants: The patient has                            taken no previous anticoagulant or antiplatelet                            agents. ASA Grade Assessment: II - A patient with                            mild systemic disease. After reviewing the risks                            and benefits, the patient was deemed in  satisfactory condition to undergo the procedure.                           After obtaining informed consent, the colonoscope                            was passed under direct vision. Throughout the                            procedure, the patient's blood pressure, pulse, and                            oxygen saturations were monitored continuously. The                            Colonoscope was introduced through  the anus and                            advanced to the the cecum, identified by                            appendiceal orifice and ileocecal valve. The                            colonoscopy was performed without difficulty. The                            patient tolerated the procedure well. The quality                            of the bowel preparation was good. The ileocecal                            valve, appendiceal orifice, and rectum were                            photographed. Scope In: 2:14:34 PM Scope Out: 2:29:56 PM Scope Withdrawal Time: 0 hours 10 minutes 33 seconds  Total Procedure Duration: 0 hours 15 minutes 22 seconds  Findings:                 The perianal and digital rectal examinations were                            normal.                           A diminutive polyp was found in the cecum. The                            polyp was sessile. The polyp was removed with a                            cold biopsy forceps. Resection and retrieval were  complete.                           Anal papilla(e) were hypertrophied.                           The exam was otherwise without abnormality on                            direct and retroflexion views. Complications:            No immediate complications. Estimated Blood Loss:     Estimated blood loss was minimal. Impression:               - One diminutive polyp in the cecum, removed with a                            cold biopsy forceps. Resected and retrieved.                           - Anal papilla(e) were hypertrophied.                           - The examination was otherwise normal on direct                            and retroflexion views. Recommendation:           - Patient has a contact number available for                            emergencies. The signs and symptoms of potential                            delayed complications were discussed with the                             patient. Return to normal activities tomorrow.                            Written discharge instructions were provided to the                            patient.                           - Resume previous diet.                           - Continue present medications.                           - Await pathology results.                           - Based on current guidelines, no repeat  screening/surveillance colonoscopy necessary, even                            with family history of colon cancer.                           - See the other procedure note for documentation of                            additional recommendations. Henry L. Loletha Carrow, MD 03/20/2019 2:43:36 PM This report has been signed electronically.

## 2019-03-20 NOTE — Progress Notes (Signed)
Report given to PACU, vss 

## 2019-03-20 NOTE — Progress Notes (Signed)
Called to room to assist during endoscopic procedure.  Patient ID and intended procedure confirmed with present staff. Received instructions for my participation in the procedure from the performing physician.  

## 2019-03-20 NOTE — Progress Notes (Signed)
Pt's states no medical or surgical changes since previsit or office visit. 

## 2019-03-21 ENCOUNTER — Telehealth: Payer: Self-pay | Admitting: *Deleted

## 2019-03-21 ENCOUNTER — Other Ambulatory Visit: Payer: Self-pay | Admitting: *Deleted

## 2019-03-21 ENCOUNTER — Telehealth: Payer: Self-pay | Admitting: Gastroenterology

## 2019-03-21 DIAGNOSIS — K449 Diaphragmatic hernia without obstruction or gangrene: Secondary | ICD-10-CM

## 2019-03-21 NOTE — Telephone Encounter (Signed)
All questions answered. No further questions or concerns voiced at the time of the call.

## 2019-03-21 NOTE — Telephone Encounter (Signed)
Scheduled the patient for CT abd/chest WO IV contrast, oral only on 10/8 at 8:30 am. Spoke to patient who requested the date be moved back to 10/15. Called radiology scheduling and rescheduled imaging to 10/15 at 8:30 am with an arrival time of 8:15 am. Patient aware to be NPO 4 hours prior, told to drink 1 bottle at 6:30 am, second bottle at 7:30 am. Verbalized understanding. No other questions or concerns voiced by the conclusion of the call.

## 2019-03-21 NOTE — Telephone Encounter (Signed)
Pt has questions regarding CT to be scheduled.

## 2019-03-22 ENCOUNTER — Telehealth: Payer: Self-pay

## 2019-03-22 NOTE — Telephone Encounter (Signed)
  Follow up Call-  Call back number 03/20/2019  Post procedure Call Back phone  # [phone]  Permission to leave phone message Yes  Some recent data might be hidden     Patient questions:  Do you have a fever, pain , or abdominal swelling? No. Pain Score  0 *  Have you tolerated food without any problems? Yes.    Have you been able to return to your normal activities? Yes.    Do you have any questions about your discharge instructions: Diet   No. Medications  No. Follow up visit  No.  Do you have questions or concerns about your Care? No.  Actions: * If pain score is 4 or above: No action needed, pain <4. Have you developed a fever since your procedure? no 2.   Have you had an respiratory symptoms (SOB or cough) since your procedure? no  3.   Have you tested positive for COVID 19 since your procedure no  4.   Have you had any family members/close contacts diagnosed with the COVID 19 since your procedure?  no   If yes to any of these questions please route to Joylene John, RN and Alphonsa Gin, Therapist, sports.

## 2019-03-25 ENCOUNTER — Encounter: Payer: Self-pay | Admitting: Gastroenterology

## 2019-03-28 ENCOUNTER — Ambulatory Visit (HOSPITAL_COMMUNITY): Payer: Medicare Other

## 2019-04-04 ENCOUNTER — Other Ambulatory Visit: Payer: Self-pay

## 2019-04-04 ENCOUNTER — Other Ambulatory Visit: Payer: Self-pay | Admitting: Gastroenterology

## 2019-04-04 ENCOUNTER — Ambulatory Visit (HOSPITAL_COMMUNITY)
Admission: RE | Admit: 2019-04-04 | Discharge: 2019-04-04 | Disposition: A | Discharge status: Home / Self Care | Payer: Medicare Other | Source: Ambulatory Visit | Attending: Gastroenterology | Admitting: Gastroenterology

## 2019-04-04 ENCOUNTER — Telehealth: Payer: Self-pay | Admitting: *Deleted

## 2019-04-04 DIAGNOSIS — K449 Diaphragmatic hernia without obstruction or gangrene: Secondary | ICD-10-CM

## 2019-04-04 IMAGING — CT CT ABD-PELV W/O CM
2 of 4 series · 12 of 36 positions shown, 15 images · non-contrast
Comparison: None.

CLINICAL DATA: Reflux, hiatal hernia. Prior inguinal hernia repair.

EXAM:
CT CHEST, ABDOMEN AND PELVIS WITHOUT CONTRAST
TECHNIQUE: Multidetector CT imaging of the chest, abdomen and pelvis was
performed following the standard protocol without IV contrast.

[Series 2: cap w/o · axial · non-contrast · 0.68mm/px · z∈[+1263,+1803]mm · 9 of 130 slices shown, 12 images]
[im 11/130  mediastinal]
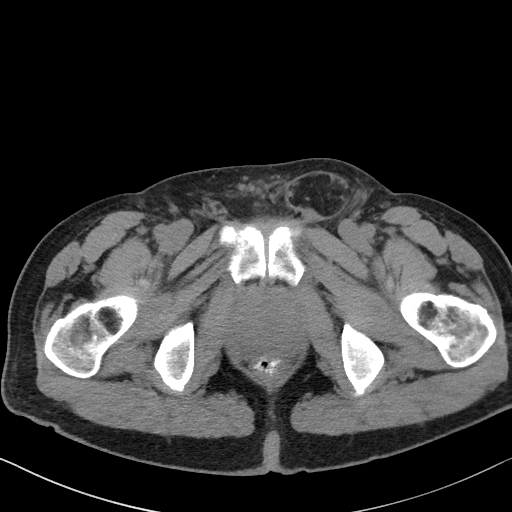
[im 11/130  lung]
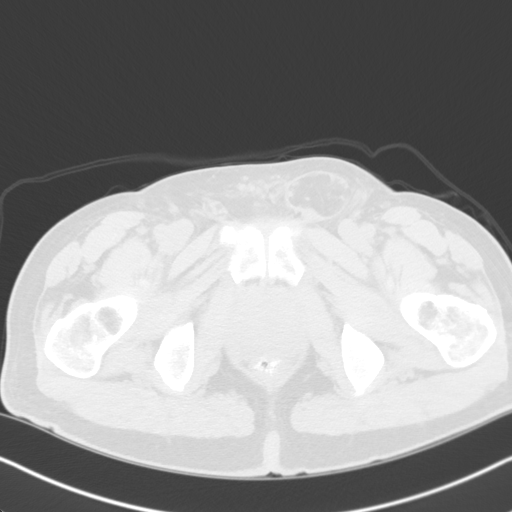
[im 22/130  lung]
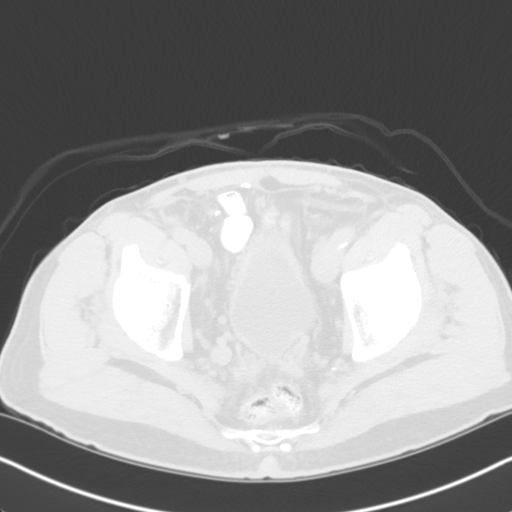
[im 44/130  lung]
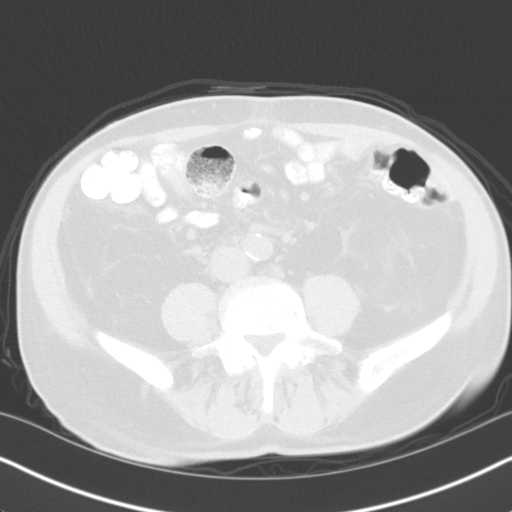
[im 54/130  lung]
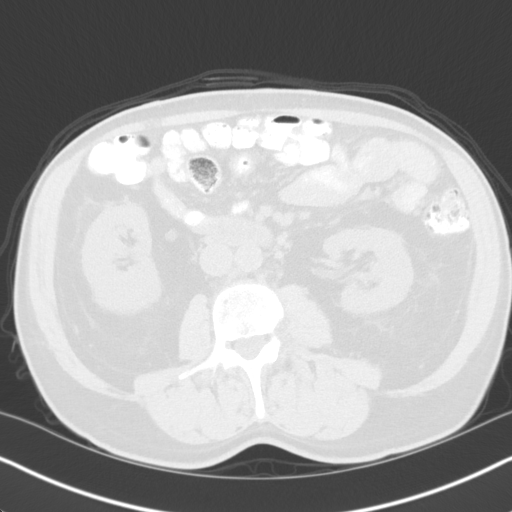
[im 65/130  mediastinal]
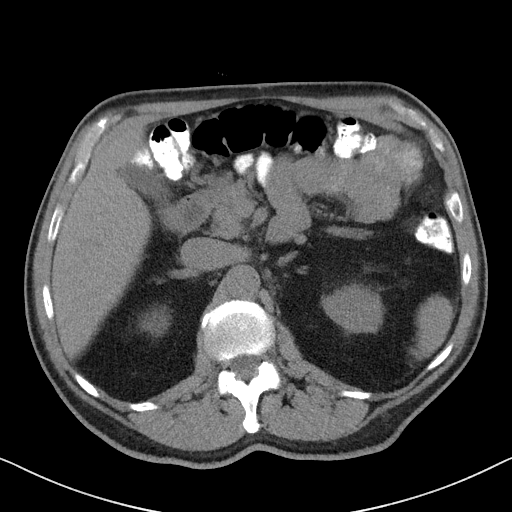
[im 65/130  lung]
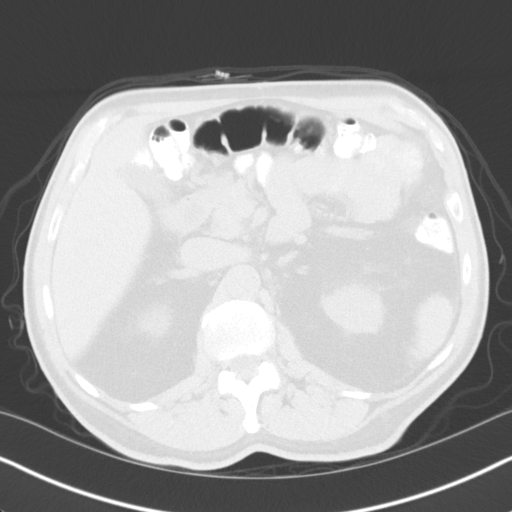
[im 76/130  lung]
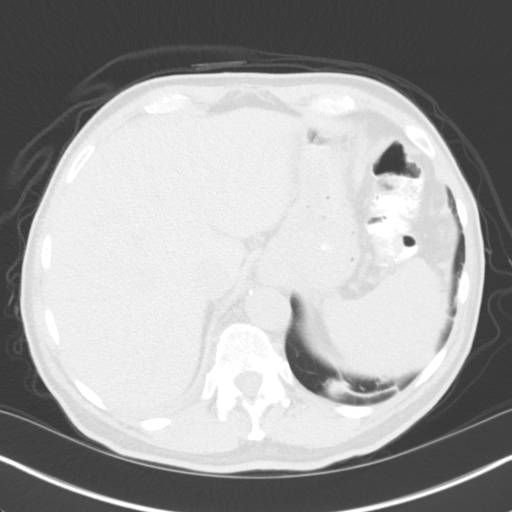
[im 87/130  lung]
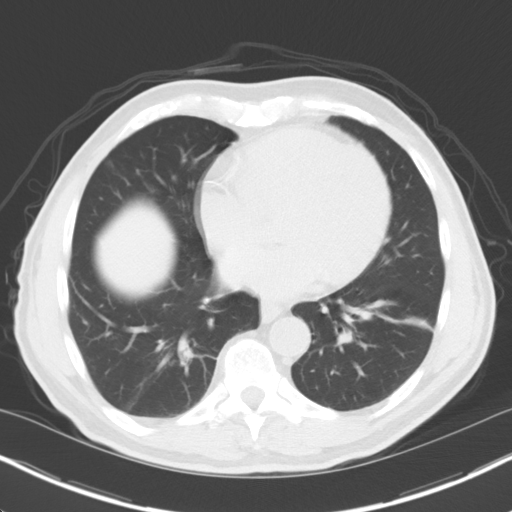
[im 108/130  lung]
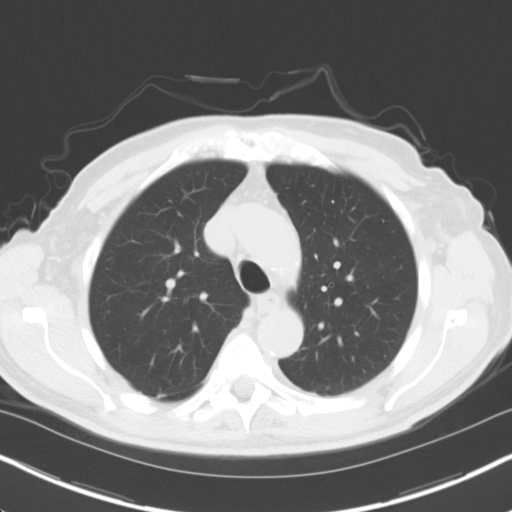
[im 119/130  mediastinal]
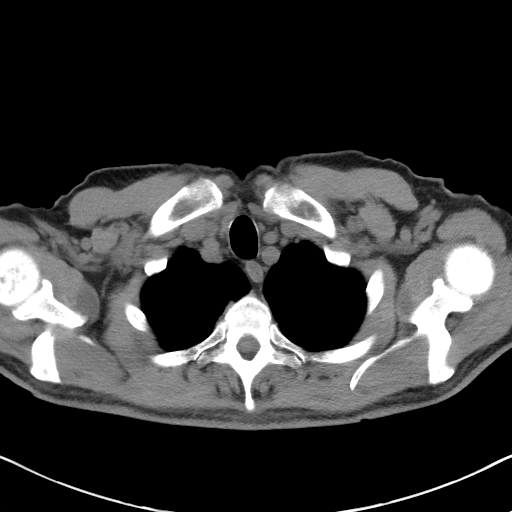
[im 119/130  lung]
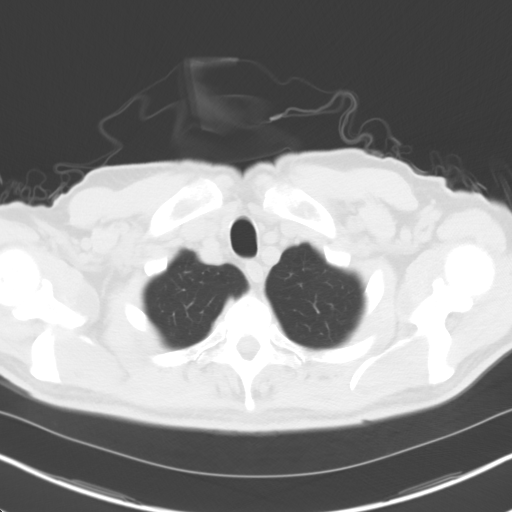

[Series 5: coronals · coronal · 0.70mm/px · 3 of 137 slices shown]
[im 28/137  lung]
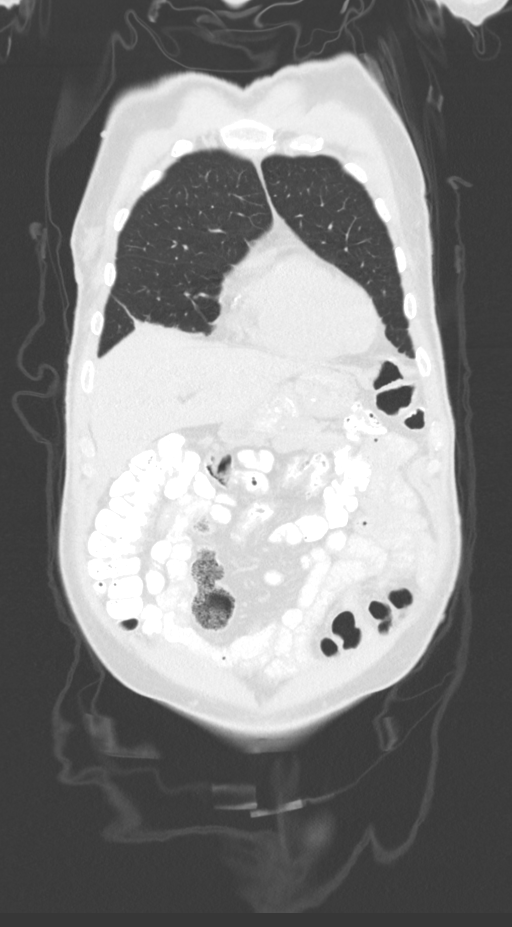
[im 55/137  lung]
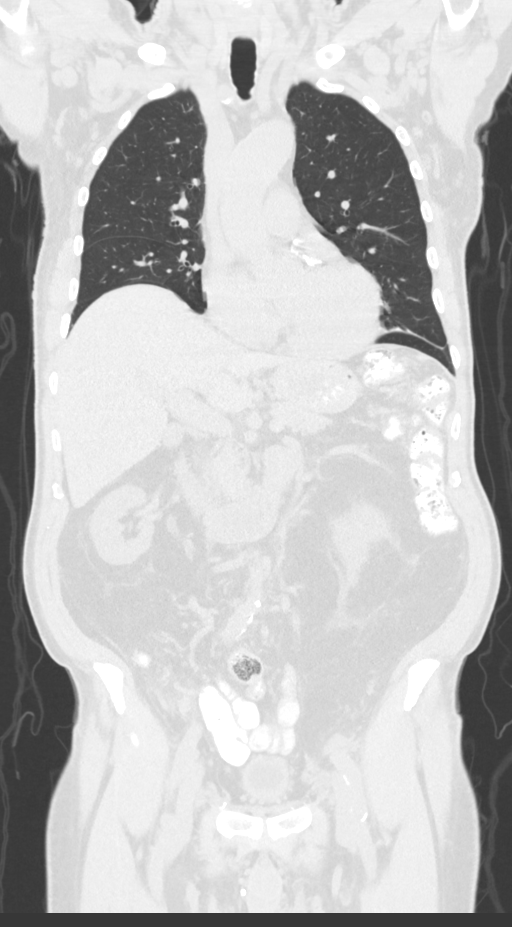
[im 82/137  lung]
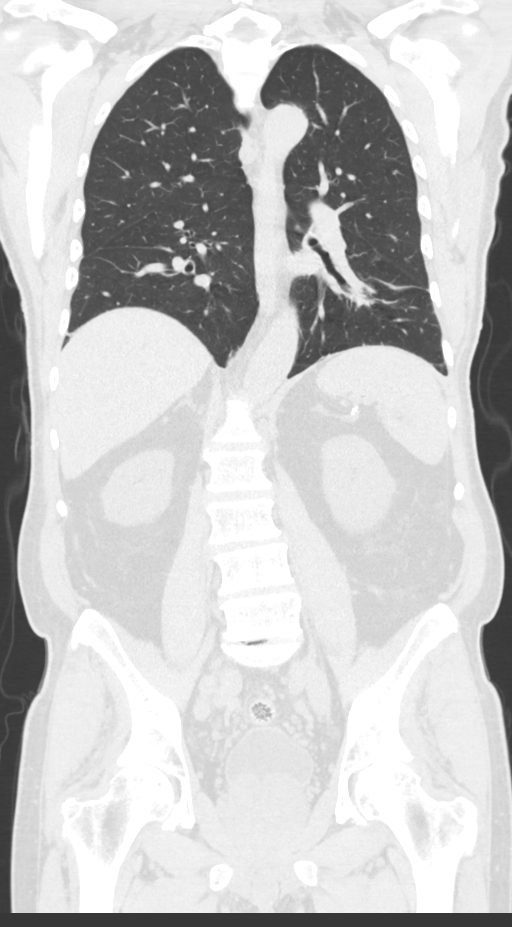

[12 of 36 positions shown; findings below may reference images not displayed]

FINDINGS: CT CHEST FINDINGS

Cardiovascular: Coronary, aortic arch, and branch vessel
atherosclerotic vascular disease.

Mediastinum/Nodes: Small scattered mediastinal, axillary, and
subpectoral lymph nodes are notable in number but not pathologically
enlarged by size criteria. An index left subpectoral node measures
0.7 cm in short axis on image [DATE]. An anterior mediastinal lymph
node measures 0.5 cm in short axis on image image [DATE].

A small type 1 hiatal hernia is present, measuring approximately
by 3.1 by 3.2 cm.

Lungs/Pleura: Mild biapical pleuroparenchymal scarring. Calcified
granuloma in the right middle lobe along the hemidiaphragm, image
116/4. Bibasilar scarring is similar to the [DATE] exam.

Musculoskeletal: Old healed right posterolateral rib fractures.

CT ABDOMEN PELVIS FINDINGS

Hepatobiliary: Unremarkable

Pancreas: Unremarkable

Spleen: Unremarkable

Adrenals/Urinary Tract: Both adrenal glands noted. Continued
prominence of perirenal adipose tissue. Fluid density exophytic
lesions of the left kidney lower pole are similar to [DATE] and
favor cysts. Similarly a small fluid density exophytic lesion of the
left kidney lower pole is stable from [YI] and probably a cyst. No
urinary tract calculi are identified. Urinary bladder appears
unremarkable given the degree of distension.

Stomach/Bowel: Small type 1 hiatal hernia. Orally administered
contrast extends through to the rectum. No dilated bowel.

Vascular/Lymphatic: Aortoiliac atherosclerotic vascular disease. A
left periaortic lymph node measures up to 1.1 cm in short axis on
image 80/2, mildly prominent, although with a fatty hilum. Smaller
retroperitoneal lymph nodes are also present. A right external iliac
node measures 1.0 cm in diameter on image 107/2, previously 0.6 cm
on [DATE].

Reproductive: Marked prostatomegaly.

Other: No supplemental non-categorized findings.

Musculoskeletal: New ventral hernia mesh along the pelvis. There
continues to be a left indirect inguinal hernia containing adipose
tissue, although smaller than previous. There is a primarily fluid
density structure along the left groin measuring 2.9 by 1.6 cm on
image 112/2 which is new and probably postoperative. No gas in this
small collection. There is stranding and mild fluid density
thickening along the presumed hernia mesh.

Mild dextroconvex lumbar scoliosis with rotary component.
Degenerative subcortical cyst formation in the acetabular roof
bilaterally. Degenerative disc disease at L5-S1.
IMPRESSION: 1. Interval hernia repair in the pelvis with anterior midline clips
or mesh and adjacent stranding. Small fluid collection along the
left side of this presumed mesh. Reduced size of the left indirect
inguinal hernia although there still a fact containing left indirect
inguinal hernia.
2. Mildly enlarged left periaortic lymph node and mildly enlarged
right external iliac lymph node, along with scattered smaller lymph
nodes in the chest and abdomen. These may well be reactive but
surveillance should be considered.
3. Other imaging findings of potential clinical significance:
Coronary atherosclerosis. Small type 1 hiatal hernia. Left renal
cysts. Marked prostatomegaly. Degenerative disc disease at L5-S1.
Old healed right posterolateral rib fractures. Stable bibasilar
scarring.

Aortic Atherosclerosis ([YI]-[YI]).

## 2019-04-04 NOTE — Telephone Encounter (Signed)
AM DOD: Dr. Tarri Glenn, John [name]  Dr. Tarri Glenn, Dr. Loletha Carrow ordered only a CT chest for this [name], but the tech scanned his abdomen and pelvis accidentally without the order. She reported abnormalities that would need follow up but was requesting that we actually put in orders for the CT abd/pelvis even though Dr. Loletha Carrow only wanted the CT chest. Can I order this? Please advise.

## 2019-04-04 NOTE — Telephone Encounter (Signed)
I reviewed Dr. Loletha Carrow' recent procedure notes. It appears that Danis wanted at CT of the chest and abdomen. If they need additional orders beyond that, please find out the name and number of the radiologist with whom I could discuss this request. Thank you.

## 2019-04-05 NOTE — Telephone Encounter (Signed)
Spoke with Maudie Mercury in [place] on 10/15, gave her Dr. Tarri Glenn message.

## 2019-04-11 ENCOUNTER — Telehealth: Payer: Self-pay | Admitting: Gastroenterology

## 2019-04-11 NOTE — Telephone Encounter (Signed)
Notified the patient of the results. He reported that the acid reducing medications work well and he is not interested at this time in a surgical consultation. The patient seemed satisfied with the results and had no questions.

## 2019-04-11 NOTE — Telephone Encounter (Signed)
This patient is inquiring about his CT chest/abd/pelvis results.

## 2019-04-11 NOTE — Telephone Encounter (Signed)
Pt inquired about CT results.  

## 2019-04-11 NOTE — Telephone Encounter (Signed)
Apologies for the delay- still catching up after having been out of the office several days.  CT scan shows that the hiatal hernia is not as large as it appeared to be on the upper endoscopy. I still suspect it is a big contributing factor to his reflux symptoms.  It is also causing the stricture and the tortuosity of the lower esophagus as noted on the EGD report.  It does not necessarily need surgical repair if he would prefer just to take acid reducing medicine for the reflux.  However, a surgical consultation would be reasonable if medicines are not completely controlling his reflux symptoms or if he just might prefer not to have to take them at all.  I would be happy to see him in clinic to discuss further.

## 2019-08-02 ENCOUNTER — Ambulatory Visit (INDEPENDENT_AMBULATORY_CARE_PROVIDER_SITE_OTHER): Payer: Medicare Other | Admitting: Vascular Surgery

## 2020-08-04 ENCOUNTER — Other Ambulatory Visit: Payer: Self-pay | Admitting: Urology

## 2020-08-04 DIAGNOSIS — R972 Elevated prostate specific antigen [PSA]: Secondary | ICD-10-CM

## 2020-08-19 ENCOUNTER — Other Ambulatory Visit: Payer: Self-pay

## 2020-08-19 ENCOUNTER — Ambulatory Visit
Admission: RE | Admit: 2020-08-19 | Discharge: 2020-08-19 | Disposition: A | Discharge status: Home / Self Care | Payer: Medicare PPO | Source: Ambulatory Visit | Attending: Urology | Admitting: Urology

## 2020-08-19 DIAGNOSIS — R972 Elevated prostate specific antigen [PSA]: Secondary | ICD-10-CM | POA: Insufficient documentation

## 2020-08-19 IMAGING — MR MR PROSTATE WO/W CM
56 series · 56 of 56 positions shown · IV contrast (7ml Gadavist)
Comparison: None.

CLINICAL DATA: Elevated PSA.  Prior biopsies in [44].

EXAM:
MR PROSTATE WITHOUT AND WITH CONTRAST
TECHNIQUE: Multiplanar multisequence MRI images were obtained of the pelvis
centered about the prostate. Pre and post contrast images were
obtained.
CONTRAST:  7mL GADAVIST GADOBUTROL 1 MMOL/ML IV SOLN

[Series 3: ax in&out whole · axial · 6.0mm · 0.74mm/px · 1 of 35 slices shown (1 of 2)]
[im 1/35]
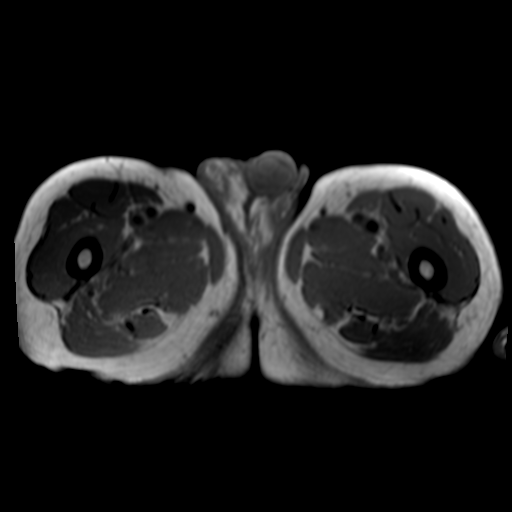

[Series 3: ax in&out whole · axial · 6.0mm · 0.74mm/px · 1 of 35 slices shown (2 of 2)]
[im 1/35]
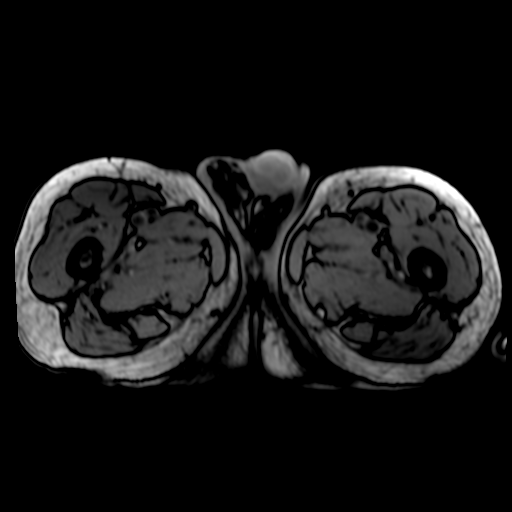

[Series 4: T2 · axial · 3.0mm · 0.56mm/px · 1 of 31 slices shown (1 of 3)]
[im 1/31]
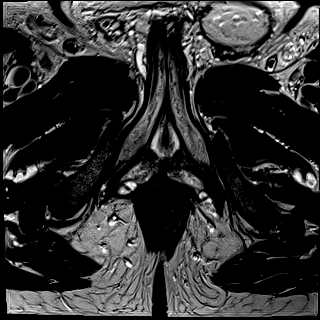

[Series 5: T2 · coronal · 3.0mm · 0.70mm/px · 1 of 35 slices shown (2 of 3)]
[im 1/35]
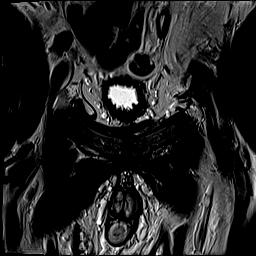

[Series 6: DWI · axial · 3.0mm · 0.86mm/px · 1 of 93 slices shown (1 of 3)]
[im 1/93]
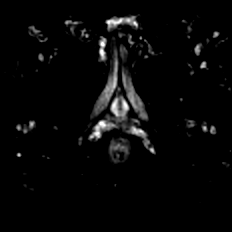

[Series 7: DWI · axial · 3.0mm · 0.86mm/px · 1 of 31 slices shown (2 of 3)]
[im 1/31]
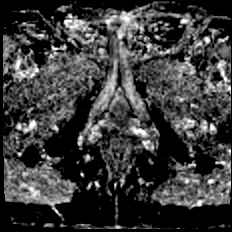

[Series 8: DWI · axial · 3.0mm · 0.86mm/px · 1 of 31 slices shown (3 of 3)]
[im 1/31]
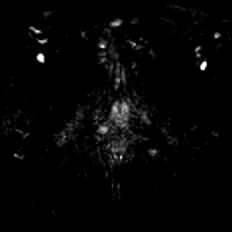

[Series 9: T2 · axial · 1.0mm · 1.04mm/px · 1 of 88 slices shown (3 of 3)]
[im 1/88]
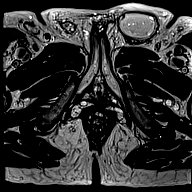

[Series 10: T1 · axial · 3.0mm · 1.15mm/px · 1 of 30 slices shown (1 of 48)]
[im 1/30]
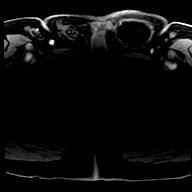

[Series 11: T1 · axial · 3.0mm · 1.15mm/px · 1 of 30 slices shown (2 of 48)]
[im 1/30]
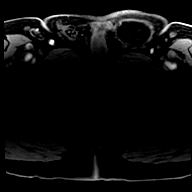

[Series 12: T1 · axial · 3.0mm · 1.15mm/px · 1 of 30 slices shown (3 of 48)]
[im 1/30]
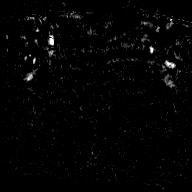

[Series 13: T1 · axial · 3.0mm · 1.15mm/px · 1 of 30 slices shown (4 of 48)]
[im 1/30]
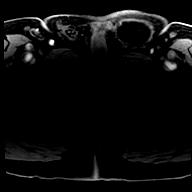

[Series 14: T1 · axial · 3.0mm · 1.15mm/px · 1 of 30 slices shown (5 of 48)]
[im 1/30]
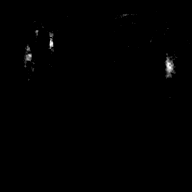

[Series 15: T1 · axial · 3.0mm · 1.15mm/px · 1 of 30 slices shown (6 of 48)]
[im 1/30]
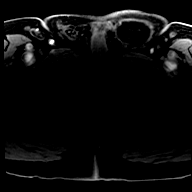

[Series 16: T1 · axial · 3.0mm · 1.15mm/px · 1 of 30 slices shown (7 of 48)]
[im 1/30]
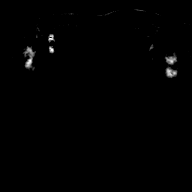

[Series 17: T1 · axial · 3.0mm · 1.15mm/px · 1 of 30 slices shown (8 of 48)]
[im 1/30]
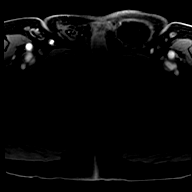

[Series 18: T1 · axial · 3.0mm · 1.15mm/px · 1 of 30 slices shown (9 of 48)]
[im 1/30]
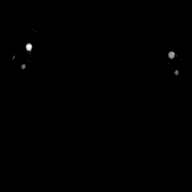

[Series 19: T1 · axial · 3.0mm · 1.15mm/px · 1 of 30 slices shown (10 of 48)]
[im 1/30]
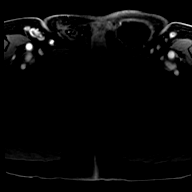

[Series 20: T1 · axial · 3.0mm · 1.15mm/px · 1 of 30 slices shown (11 of 48)]
[im 1/30]
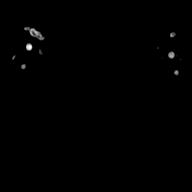

[Series 21: T1 · axial · 3.0mm · 1.15mm/px · 1 of 30 slices shown (12 of 48)]
[im 1/30]
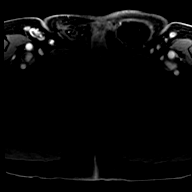

[Series 22: T1 · axial · 3.0mm · 1.15mm/px · 1 of 30 slices shown (13 of 48)]
[im 1/30]
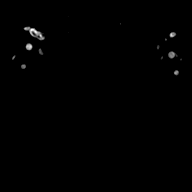

[Series 23: T1 · axial · 3.0mm · 1.15mm/px · 1 of 30 slices shown (14 of 48)]
[im 1/30]
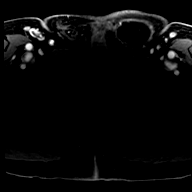

[Series 24: T1 · axial · 3.0mm · 1.15mm/px · 1 of 30 slices shown (15 of 48)]
[im 1/30]
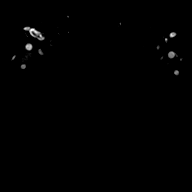

[Series 25: T1 · axial · 3.0mm · 1.15mm/px · 1 of 30 slices shown (16 of 48)]
[im 1/30]
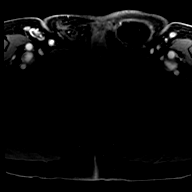

[Series 26: T1 · axial · 3.0mm · 1.15mm/px · 1 of 30 slices shown (17 of 48)]
[im 1/30]
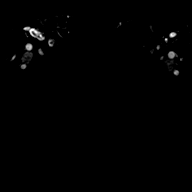

[Series 27: T1 · axial · 3.0mm · 1.15mm/px · 1 of 30 slices shown (18 of 48)]
[im 1/30]
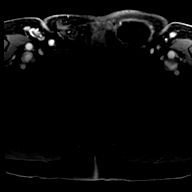

[Series 28: T1 · axial · 3.0mm · 1.15mm/px · 1 of 30 slices shown (19 of 48)]
[im 1/30]
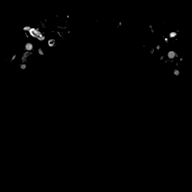

[Series 29: T1 · axial · 3.0mm · 1.15mm/px · 1 of 30 slices shown (20 of 48)]
[im 1/30]
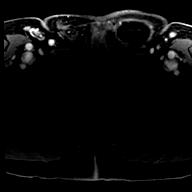

[Series 30: T1 · axial · 3.0mm · 1.15mm/px · 1 of 30 slices shown (21 of 48)]
[im 1/30]
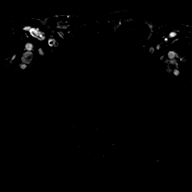

[Series 31: T1 · axial · 3.0mm · 1.15mm/px · 1 of 30 slices shown (22 of 48)]
[im 1/30]
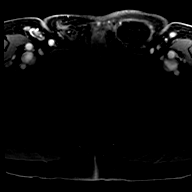

[Series 32: T1 · axial · 3.0mm · 1.15mm/px · 1 of 30 slices shown (23 of 48)]
[im 1/30]
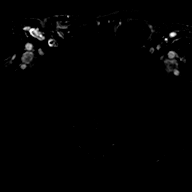

[Series 33: T1 · axial · 3.0mm · 1.15mm/px · 1 of 30 slices shown (24 of 48)]
[im 1/30]
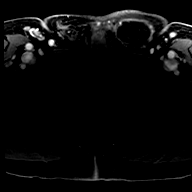

[Series 34: T1 · axial · 3.0mm · 1.15mm/px · 1 of 30 slices shown (25 of 48)]
[im 1/30]
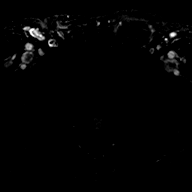

[Series 35: T1 · axial · 3.0mm · 1.15mm/px · 1 of 30 slices shown (26 of 48)]
[im 1/30]
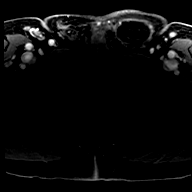

[Series 36: T1 · axial · 3.0mm · 1.15mm/px · 1 of 30 slices shown (27 of 48)]
[im 1/30]
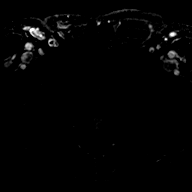

[Series 37: T1 · axial · 3.0mm · 1.15mm/px · 1 of 30 slices shown (28 of 48)]
[im 1/30]
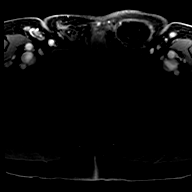

[Series 38: T1 · axial · 3.0mm · 1.15mm/px · 1 of 30 slices shown (29 of 48)]
[im 1/30]
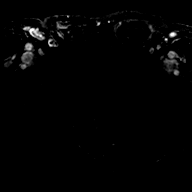

[Series 39: T1 · axial · 3.0mm · 1.15mm/px · 1 of 30 slices shown (30 of 48)]
[im 1/30]
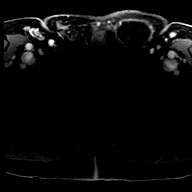

[Series 40: T1 · axial · 3.0mm · 1.15mm/px · 1 of 30 slices shown (31 of 48)]
[im 1/30]
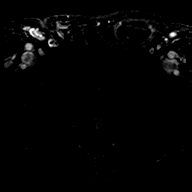

[Series 41: T1 · axial · 3.0mm · 1.15mm/px · 1 of 30 slices shown (32 of 48)]
[im 1/30]
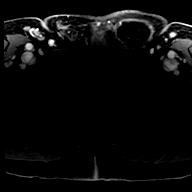

[Series 42: T1 · axial · 3.0mm · 1.15mm/px · 1 of 30 slices shown (33 of 48)]
[im 1/30]
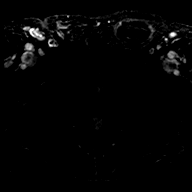

[Series 43: T1 · axial · 3.0mm · 1.15mm/px · 1 of 30 slices shown (34 of 48)]
[im 1/30]
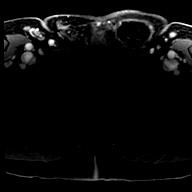

[Series 44: T1 · axial · 3.0mm · 1.15mm/px · 1 of 30 slices shown (35 of 48)]
[im 1/30]
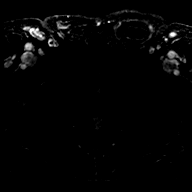

[Series 45: T1 · axial · 3.0mm · 1.15mm/px · 1 of 30 slices shown (36 of 48)]
[im 1/30]
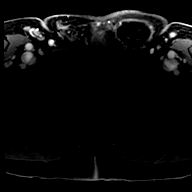

[Series 46: T1 · axial · 3.0mm · 1.15mm/px · 1 of 30 slices shown (37 of 48)]
[im 1/30]
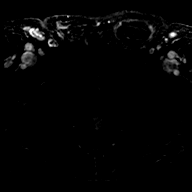

[Series 47: T1 · axial · 3.0mm · 1.15mm/px · 1 of 30 slices shown (38 of 48)]
[im 1/30]
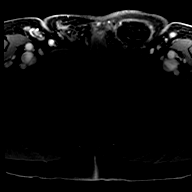

[Series 48: T1 · axial · 3.0mm · 1.15mm/px · 1 of 30 slices shown (39 of 48)]
[im 1/30]
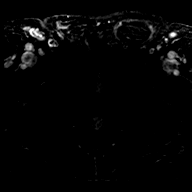

[Series 49: T1 · axial · 3.0mm · 1.15mm/px · 1 of 30 slices shown (40 of 48)]
[im 1/30]
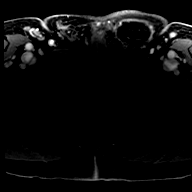

[Series 50: T1 · axial · 3.0mm · 1.15mm/px · 1 of 30 slices shown (41 of 48)]
[im 1/30]
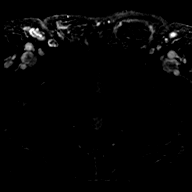

[Series 51: T1 · axial · 3.0mm · 1.15mm/px · 1 of 30 slices shown (42 of 48)]
[im 1/30]
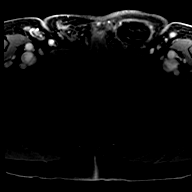

[Series 52: T1 · axial · 3.0mm · 1.15mm/px · 1 of 30 slices shown (43 of 48)]
[im 1/30]
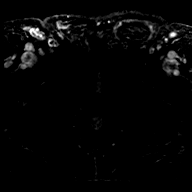

[Series 53: T1 · axial · 3.0mm · 1.15mm/px · 1 of 30 slices shown (44 of 48)]
[im 1/30]
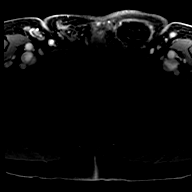

[Series 54: T1 · axial · 3.0mm · 1.15mm/px · 1 of 30 slices shown (45 of 48)]
[im 1/30]
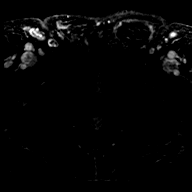

[Series 55: T1 · axial · 3.0mm · 1.15mm/px · 1 of 30 slices shown (46 of 48)]
[im 1/30]
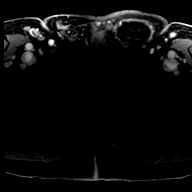

[Series 56: T1 · axial · 3.0mm · 1.15mm/px · 1 of 30 slices shown (47 of 48)]
[im 1/30]
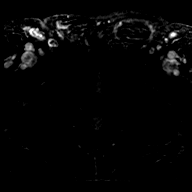

[Series 57: T1 · axial · 3.0mm · 1.15mm/px · 1 of 30 slices shown (48 of 48)]
[im 1/30]
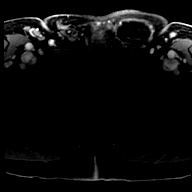

[56 of 56 positions shown; findings below may reference images not displayed]

FINDINGS: Prostate: The peripheral zone is thinned by the enlarged nodular
transitional zone. No focal signal abnormality within the peripheral
zone on T2 weighted imaging (series 4). No foci of restricted
diffusion within the peripheral zone (series 6 and series 7).

The transitional zone is enlarged by multiple well capsulated
nodules. No suspicious imaging findings on T2 weighted imaging
(series 4).

No abnormal enhancement pattern.

Volume: 4.8 x 5.6 x 5.2 cm (volume = 73 cm^3)

Transcapsular spread:  Absent

Seminal vesicle involvement: Absent

Neurovascular bundle involvement: Absent

Pelvic adenopathy: Several small pelvic lymph nodes. For example 8
mm RIGHT external iliac node adjacent the operator fossa on image
29/series 60. LEFT operator node measures 5 mm on image 27. RIGHT
external iliac node anterior to the acetabulum measures 8 mm on
image 41. lymph nodes not changed in size from CT [DATE]

Bone metastasis: present/absent/other

Other findings: Venous collaterals in the deep RIGHT pelvis. Large
LEFT fat filled inguinal hernia.
IMPRESSION: 1. No high-grade carcinoma within the peripheral zone ( PI-RADS: 1).
2. Enlarged nodular transitional zone most consistent benign
prostate hypertrophy. PI-RADS: 2
3. Several small pelvic lymph nodes are not changed from CT
[DATE].
[DATE]. Prostatomegaly.
5. Incidental finding of venous collaterals in the RIGHT pelvis and
LEFT inguinal hernia.

## 2020-08-19 MED ORDER — GADOBUTROL 1 MMOL/ML IV SOLN
7.0000 mL | Freq: Once | INTRAVENOUS | Status: AC | PRN
  Administered 2020-08-19: 7 mL via INTRAVENOUS

## 2022-04-18 ENCOUNTER — Encounter (INDEPENDENT_AMBULATORY_CARE_PROVIDER_SITE_OTHER): Payer: Self-pay

## 2023-09-15 ENCOUNTER — Other Ambulatory Visit: Payer: Self-pay | Admitting: Urology

## 2023-09-15 DIAGNOSIS — R972 Elevated prostate specific antigen [PSA]: Secondary | ICD-10-CM

## 2023-09-20 ENCOUNTER — Other Ambulatory Visit: Payer: Self-pay | Admitting: Urology

## 2023-09-20 DIAGNOSIS — R972 Elevated prostate specific antigen [PSA]: Secondary | ICD-10-CM

## 2023-09-29 ENCOUNTER — Ambulatory Visit
Admission: RE | Admit: 2023-09-29 | Discharge: 2023-09-29 | Disposition: A | Discharge status: Home / Self Care | Source: Ambulatory Visit | Attending: Urology | Admitting: Urology

## 2023-09-29 DIAGNOSIS — R972 Elevated prostate specific antigen [PSA]: Secondary | ICD-10-CM

## 2023-09-29 MED ORDER — GADOBUTROL 1 MMOL/ML IV SOLN
7.0000 mL | Freq: Once | INTRAVENOUS | Status: AC | PRN
  Administered 2023-09-29: 7 mL via INTRAVENOUS

## 2024-06-27 ENCOUNTER — Encounter: Payer: Self-pay | Admitting: Ophthalmology

## 2024-07-01 NOTE — Discharge Instructions (Signed)

## 2024-07-03 ENCOUNTER — Encounter: Admission: RE | Disposition: A | Payer: Self-pay | Source: Home / Self Care | Attending: Ophthalmology

## 2024-07-03 ENCOUNTER — Encounter: Payer: Self-pay | Admitting: Anesthesiology

## 2024-07-03 ENCOUNTER — Ambulatory Visit
Admission: RE | Admit: 2024-07-03 | Discharge: 2024-07-03 | Disposition: A | Discharge status: Home / Self Care | Attending: Ophthalmology | Admitting: Ophthalmology

## 2024-07-03 ENCOUNTER — Encounter: Payer: Self-pay | Admitting: Ophthalmology

## 2024-07-03 ENCOUNTER — Ambulatory Visit: Payer: Self-pay | Admitting: Anesthesiology

## 2024-07-03 ENCOUNTER — Other Ambulatory Visit: Payer: Self-pay

## 2024-07-03 DIAGNOSIS — Z87891 Personal history of nicotine dependence: Secondary | ICD-10-CM | POA: Diagnosis not present

## 2024-07-03 DIAGNOSIS — H2511 Age-related nuclear cataract, right eye: Secondary | ICD-10-CM | POA: Insufficient documentation

## 2024-07-03 DIAGNOSIS — I10 Essential (primary) hypertension: Secondary | ICD-10-CM | POA: Insufficient documentation

## 2024-07-03 HISTORY — PX: CATARACT EXTRACTION W/PHACO: SHX586

## 2024-07-03 HISTORY — DX: Unspecified osteoarthritis, unspecified site: M19.90

## 2024-07-03 MED ORDER — PHENYLEPHRINE HCL 10 % OP SOLN
OPHTHALMIC | Status: AC
  Filled 2024-07-03: qty 5

## 2024-07-03 MED ORDER — CYCLOPENTOLATE HCL 2 % OP SOLN
OPHTHALMIC | Status: AC
  Filled 2024-07-03: qty 2

## 2024-07-03 MED ORDER — BRIMONIDINE TARTRATE-TIMOLOL 0.2-0.5 % OP SOLN
OPHTHALMIC | Status: DC | PRN
  Administered 2024-07-03: 1 [drp] via OPHTHALMIC

## 2024-07-03 MED ORDER — TETRACAINE HCL 0.5 % OP SOLN
OPHTHALMIC | Status: AC
  Filled 2024-07-03: qty 4

## 2024-07-03 MED ORDER — PHENYLEPHRINE HCL 10 % OP SOLN: 1.0000 [drp] | OPHTHALMIC | Status: DC | PRN

## 2024-07-03 MED ORDER — MIDAZOLAM HCL 2 MG/2ML IJ SOLN
INTRAMUSCULAR | Status: AC
  Filled 2024-07-03: qty 2

## 2024-07-03 MED ORDER — SIGHTPATH DOSE#1 BSS IO SOLN
INTRAOCULAR | Status: DC | PRN
  Administered 2024-07-03: 73 mL via OPHTHALMIC

## 2024-07-03 MED ORDER — SIGHTPATH DOSE#1 NA HYALUR & NA CHOND-NA HYALUR IO KIT
PACK | INTRAOCULAR | Status: DC | PRN
  Administered 2024-07-03: 1 via OPHTHALMIC

## 2024-07-03 MED ORDER — SIGHTPATH DOSE#1 BSS IO SOLN
INTRAOCULAR | Status: DC | PRN
  Administered 2024-07-03: 15 mL via INTRAOCULAR

## 2024-07-03 MED ORDER — MIDAZOLAM HCL (PF) 2 MG/2ML IJ SOLN
INTRAMUSCULAR | Status: DC | PRN
  Administered 2024-07-03: 2 mg via INTRAVENOUS

## 2024-07-03 MED ORDER — CYCLOPENTOLATE HCL 2 % OP SOLN
1.0000 [drp] | OPHTHALMIC | Status: DC | PRN
  Administered 2024-07-03 (×2): 1 [drp] via OPHTHALMIC

## 2024-07-03 MED ORDER — LIDOCAINE HCL (PF) 2 % IJ SOLN
INTRAOCULAR | Status: DC | PRN
  Administered 2024-07-03: 2 mL

## 2024-07-03 MED ORDER — CYCLOPENTOLATE HCL 2 % OP SOLN: 1.0000 [drp] | OPHTHALMIC | Status: DC | PRN

## 2024-07-03 MED ORDER — PHENYLEPHRINE HCL 10 % OP SOLN
1.0000 [drp] | OPHTHALMIC | Status: AC | PRN
  Administered 2024-07-03 (×3): 1 [drp] via OPHTHALMIC

## 2024-07-03 MED ORDER — CEFUROXIME OPHTHALMIC INJECTION 1 MG/0.1 ML
INJECTION | OPHTHALMIC | Status: DC | PRN
  Administered 2024-07-03: 1 mg via INTRACAMERAL

## 2024-07-03 MED ORDER — TETRACAINE HCL 0.5 % OP SOLN
1.0000 [drp] | OPHTHALMIC | Status: DC | PRN
  Administered 2024-07-03 (×3): 1 [drp] via OPHTHALMIC

## 2024-07-03 MED ORDER — FENTANYL CITRATE (PF) 100 MCG/2ML IJ SOLN
INTRAMUSCULAR | Status: AC
  Filled 2024-07-03: qty 2

## 2024-07-03 MED ORDER — FENTANYL CITRATE (PF) 100 MCG/2ML IJ SOLN
INTRAMUSCULAR | Status: DC | PRN
  Administered 2024-07-03: 50 ug via INTRAVENOUS

## 2024-07-03 NOTE — Op Note (Signed)
 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   PROCEDURE:  Phacoemulsification with Toric posterior chamber intraocular lens placement of the right eye.  Ultrasound time: Procedures: PHACOEMULSIFICATION, CATARACT, WITH IOL INSERTION 4.99 00:26.3 (Right)  LENS:   Implant Name Type Inv. Item Serial No. Manufacturer Lot No. LRB No. Used Action  [place] PanOptix Pro IOL 14.5 Intraocular Lens  [id number] SIGHTPATH  Right 1 Implanted     [id number] Panoptix Toric intraocular lens with 1.5 diopters of cylindrical power with axis orientation at 83 degrees.   SURGEON:  Dene FABIENE Etienne, MD   ANESTHESIA: Topical with tetracaine  drops and 2% Xylocaine  jelly, augmented with 1% preservative-free intracameral lidocaine . .   COMPLICATIONS:  None.   DESCRIPTION OF PROCEDURE:  The patient was identified in the holding room and transported to the operating suite and placed in the supine position under the operating microscope.  The right eye was identified as the operative eye, and it was prepped and draped in the usual sterile ophthalmic fashion.    A clear-corneal paracentesis incision was made at the 12:00 position.  0.5 ml of preservative-free 1% lidocaine  was injected into the anterior chamber. The anterior chamber was filled with Viscoat.  A 2.4 millimeter near clear corneal incision was then made at the 9:00 position.  A cystotome and capsulorrhexis forceps were then used to make a curvilinear capsulorrhexis.  Hydrodissection and hydrodelineation were then performed using balanced salt solution.   Phacoemulsification was then used in stop and chop fashion to remove the lens, nucleus and epinucleus.  The remaining cortex was aspirated using the irrigation and aspiration handpiece.  Provisc viscoelastic was then placed into the capsular bag to distend it for  lens placement.  The Verion digital marker was used to align the implant at the intended axis.   A Toric lens was then injected into the capsular bag.  It was rotated clockwise until the axis marks on the lens were approximately 15 degrees in the counterclockwise direction to the intended alignment.  The viscoelastic was aspirated from the eye using the irrigation aspiration handpiece.  Then, a Koch spatula through the sideport incision was used to rotate the lens in a clockwise direction until the axis markings of the intraocular lens were lined up with the Verion alignment.  Balanced salt solution was then used to hydrate the wounds. Cefuroxime  0.1 ml of a 10mg /ml solution was injected into the anterior chamber for a dose of 1 mg of intracameral antibiotic at the completion of the case.    The eye was noted to have a physiologic pressure and there was no wound leak noted.   Timolol  and Brimonidine  drops were applied to the eye.  The patient was taken to the recovery room in stable condition having had no complications of anesthesia or surgery.  Marcha Licklider 07/03/2024, 11:23 AM

## 2024-07-03 NOTE — Anesthesia Postprocedure Evaluation (Signed)
"   Anesthesia Post Note  Patient: John Long.  Procedure(s) Performed: PHACOEMULSIFICATION, CATARACT, WITH IOL INSERTION 4.99 00:26.3 (Right: Eye)  Patient location during evaluation: PACU Anesthesia Type: MAC Level of consciousness: awake and alert Pain management: pain level controlled Vital Signs Assessment: post-procedure vital signs reviewed and stable Respiratory status: spontaneous breathing, nonlabored ventilation, respiratory function stable and patient connected to nasal cannula oxygen Cardiovascular status: blood pressure returned to baseline and stable Postop Assessment: no apparent nausea or vomiting Anesthetic complications: no   No notable events documented.   Last Vitals:  Vitals:   07/03/24 1126 07/03/24 1130  BP: 130/86 119/87  Pulse: 65 66  Resp: 15 11  Temp: 36.4 C   SpO2: 98% 95%    Last Pain:  Vitals:   07/03/24 1130  TempSrc:   PainSc: 0-No pain                 [name]      "

## 2024-07-03 NOTE — H&P (Signed)
 " [facility]   Primary Care Physician:  Rudolpho Norleen BIRCH, MD Ophthalmologist: Dr. Dene Etienne  Pre-Procedure History & Physical: HPI:  John Aument. is a 84 y.o. male here for ophthalmic surgery.   Past Medical History:  Diagnosis Date   Allergy    Arthritis    Enlarged prostate    Family history of colon cancer    GERD (gastroesophageal reflux disease)    Hypertension     Past Surgical History:  Procedure Laterality Date   BLEPHAROPLASTY Bilateral    COLONOSCOPY WITH PROPOFOL      HERNIA REPAIR Bilateral 07/24/2017   INGUINAL HERNIA REPAIR Left 09/05/2018   Medium Bard Perfix Plug; RECURRENT HERNIA REPAIR INGUINAL ADULT-LEFT;  Surgeon: Dessa Reyes ORN, MD;  Location: [facility] ORS;  Service: General;  Laterality: Left;   OPEN REDUCTION INTERNAL FIXATION (ORIF) DISTAL RADIAL FRACTURE Left 02/08/2018   Procedure: OPEN REDUCTION INTERNAL FIXATION (ORIF) DISTAL RADIAL FRACTURE;  Surgeon: Kathlynn Sharper, MD;  Location: [facility] ORS;  Service: Orthopedics;  Laterality: Left;   TONSILLECTOMY  1949    Prior to Admission medications  Medication Sig Start Date End Date Taking? Authorizing Provider  acetaminophen  (TYLENOL ) 500 MG tablet Take 500 mg by mouth every 6 (six) hours as needed.   Yes [provider]  albuterol (VENTOLIN HFA) 108 (90 Base) MCG/ACT inhaler Inhale into the lungs every 6 (six) hours as needed for wheezing or shortness of breath.   Yes [provider]  amLODipine (NORVASC) 5 MG tablet Take 5 mg by mouth daily.   Yes [provider]  cholecalciferol (VITAMIN D3) 25 MCG (1000 UT) tablet Take 1,000 Units by mouth daily.   Yes [provider]  cyanocobalamin (VITAMIN B12) 1000 MCG tablet Take 1,000 mcg by mouth daily.   Yes [provider]  doxazosin (CARDURA) 2 MG tablet Take 8 mg by mouth daily.  09/26/18  Yes [provider]  Dupilumab (DUPIXENT) 300 MG/2ML SOAJ Inject into the skin once a week.   Yes  [provider]  finasteride (PROSCAR) 5 MG tablet Take 5 mg by mouth daily.   Yes [provider]  ibuprofen (ADVIL) 200 MG tablet Take 200 mg by mouth every 6 (six) hours as needed.   Yes [provider]  omeprazole (PRILOSEC) 20 MG capsule Take 20 mg by mouth daily.   Yes [provider]  sildenafil (REVATIO) 20 MG tablet Take 40-60 mg by mouth daily as needed.   Yes [provider]  telmisartan (MICARDIS) 80 MG tablet Take 80 mg by mouth daily.   Yes [provider]  ketoconazole (NIZORAL) 2 % shampoo Apply 1 application topically 2 (two) times a week. Patient not taking: Reported on 06/27/2024    [provider]  Multiple Vitamins-Minerals (ICAPS AREDS 2 PO) Take 1 capsule by mouth daily.  Patient not taking: Reported on 06/27/2024    [provider]  triamcinolone cream (KENALOG) 0.1 % Apply 1 application topically daily. Patient not taking: Reported on 06/27/2024    [provider]    Allergies as of 04/25/2024 - Review Complete 09/29/2023  Allergen Reaction Noted   Vasotec [enalapril maleate] Cough 07/31/2018    Family History  Problem Relation Age of Onset   Colon cancer Father 43   Dementia Mother    Esophageal cancer Neg Hx    Stomach cancer Neg Hx    Rectal cancer Neg Hx     Social History   Socioeconomic History  Marital status: Married    Spouse name: Not on file   Number of children: Not on file   Years of education: Not on file   Highest education level: Not on file  Occupational History   Not on file  Tobacco Use   Smoking status: Former    Current packs/day: 0.00    Average packs/day: 1 pack/day for 20.0 years (20.0 ttl pk-yrs)    Types: Cigarettes    Start date: 06/20/1977    Quit date: 06/20/1997    Years since quitting: 27.0   Smokeless tobacco: Never  Substance and Sexual Activity   Alcohol use: Yes    Alcohol/week: 14.0 standard drinks of alcohol    Types: 14 Glasses of  wine per week    Comment: 1-2/day   Drug use: No   Sexual activity: Yes  Other Topics Concern   Not on file  Social History Narrative   Not on file   Social Drivers of Health   Tobacco Use: Medium Risk (06/27/2024)   Patient History    Smoking Tobacco Use: Former    Smokeless Tobacco Use: Never    Passive Exposure: Not on file  Financial Resource Strain: Low Risk  (08/22/2023)   Received from [facility]   Overall Financial Resource Strain (CARDIA)    Difficulty of Paying Living Expenses: Not hard at all  Food Insecurity: No Food Insecurity (08/22/2023)   Received from [facility]   Epic    Within the past 12 months, you worried that your food would run out before you got the money to buy more.: Never true    Within the past 12 months, the food you bought just didn't last and you didn't have money to get more.: Never true  Transportation Needs: No Transportation Needs (08/22/2023)   Received from [facility] - Transportation    In the past 12 months, has lack of transportation kept you from medical appointments or from getting medications?: No    Lack of Transportation (Non-Medical): No  Physical Activity: Not on file  Stress: Not on file  Social Connections: Not on file  Intimate Partner Violence: Not on file  Depression (EYV7-0): Not on file  Alcohol Screen: Not on file  Housing: Unknown (08/22/2023)   Received from [facility]   Epic    In the last 12 months, was there a time when you were not able to pay the mortgage or rent on time?: No    Number of Times Moved in the Last Year: Not on file    At any time in the past 12 months, were you homeless or living in a shelter (including now)?: No  Utilities: Not At Risk (08/22/2023)   Received from [facility]ities    Threatened with loss of utilities: No  Health Literacy: Not on file    Review of Systems: See HPI,  otherwise negative ROS  Physical Exam: Ht 5' 7 (1.702 m)   Wt 73.9 kg   BMI 25.53 kg/m  General:   Alert,  pleasant and cooperative in NAD Head:  Normocephalic and atraumatic. Lungs:  Clear to auscultation.    Heart:  Regular rate and rhythm.   Impression/Plan: John R Dicostanzo Jr. is here for ophthalmic surgery.  Risks, benefits, limitations, and alternatives regarding ophthalmic surgery have been reviewed with the patient.  Questions have been answered.  All parties agreeable.   John GASKIN,  MD  07/03/2024, 10:07 AM  "

## 2024-07-03 NOTE — Anesthesia Preprocedure Evaluation (Signed)
"                                    Anesthesia Evaluation  Patient identified by MRN, date of birth, ID band Patient awake    Reviewed: Allergy & Precautions, H&P , NPO status , Patient's Chart, lab work & pertinent test results  Airway Mallampati: II  TM Distance: >3 FB Neck ROM: Full    Dental no notable dental hx.    Pulmonary neg pulmonary ROS, former smoker   Pulmonary exam normal breath sounds clear to auscultation       Cardiovascular hypertension, negative cardio ROS Normal cardiovascular exam Rhythm:Regular Rate:Normal     Neuro/Psych negative neurological ROS  negative psych ROS   GI/Hepatic negative GI ROS, Neg liver ROS,,,  Endo/Other  negative endocrine ROS    Renal/GU negative Renal ROS  negative genitourinary   Musculoskeletal negative musculoskeletal ROS (+)    Abdominal   Peds negative pediatric ROS (+)  Hematology negative hematology ROS (+)   Anesthesia Other Findings   Reproductive/Obstetrics negative OB ROS                              Anesthesia Physical Anesthesia Plan  ASA: 3  Anesthesia Plan: MAC   Post-op Pain Management:    Induction: Intravenous  PONV Risk Score and Plan: 0  Airway Management Planned:   Additional Equipment:   Intra-op Plan:   Post-operative Plan: Extubation in OR  Informed Consent: I have reviewed the patients History and Physical, chart, labs and discussed the procedure including the risks, benefits and alternatives for the proposed anesthesia with the patient or authorized representative who has indicated his/her understanding and acceptance.     Dental advisory given  Plan Discussed with: CRNA  Anesthesia Plan Comments:         Anesthesia Quick Evaluation  "

## 2024-07-03 NOTE — Transfer of Care (Signed)
 Immediate Anesthesia Transfer of Care Note  Patient: John Long.  Procedure(s) Performed: PHACOEMULSIFICATION, CATARACT, WITH IOL INSERTION 4.99 00:26.3 (Right: Eye)  Patient Location: PACU  Anesthesia Type: MAC  Level of Consciousness: awake, alert  and patient cooperative  Airway and Oxygen Therapy: Patient Spontanous Breathing and Patient connected to supplemental oxygen  Post-op Assessment: Post-op Vital signs reviewed, Patient's Cardiovascular Status Stable, Respiratory Function Stable, Patent Airway and No signs of Nausea or vomiting  Post-op Vital Signs: Reviewed and stable  Complications: No notable events documented.

## 2024-07-04 ENCOUNTER — Encounter: Payer: Self-pay | Admitting: Ophthalmology

## 2024-07-11 ENCOUNTER — Encounter: Payer: Self-pay | Admitting: Ophthalmology

## 2024-07-16 NOTE — Discharge Instructions (Signed)

## 2024-07-17 ENCOUNTER — Encounter
Admission: RE | Disposition: A | Discharge status: Home / Self Care | Payer: Self-pay | Source: Home / Self Care | Attending: Ophthalmology

## 2024-07-17 ENCOUNTER — Ambulatory Visit: Payer: Self-pay | Admitting: Anesthesiology

## 2024-07-17 ENCOUNTER — Other Ambulatory Visit: Payer: Self-pay

## 2024-07-17 ENCOUNTER — Ambulatory Visit: Admission: RE | Admit: 2024-07-17 | Source: Home / Self Care | Admitting: Ophthalmology

## 2024-07-17 ENCOUNTER — Encounter: Payer: Self-pay | Admitting: Ophthalmology

## 2024-07-17 DIAGNOSIS — Z87891 Personal history of nicotine dependence: Secondary | ICD-10-CM | POA: Diagnosis not present

## 2024-07-17 DIAGNOSIS — H2512 Age-related nuclear cataract, left eye: Secondary | ICD-10-CM | POA: Diagnosis present

## 2024-07-17 DIAGNOSIS — I10 Essential (primary) hypertension: Secondary | ICD-10-CM | POA: Insufficient documentation

## 2024-07-17 MED ORDER — FENTANYL CITRATE (PF) 100 MCG/2ML IJ SOLN
INTRAMUSCULAR | Status: DC | PRN
  Administered 2024-07-17: 50 ug via INTRAVENOUS

## 2024-07-17 MED ORDER — MIDAZOLAM HCL (PF) 2 MG/2ML IJ SOLN
INTRAMUSCULAR | Status: DC | PRN
  Administered 2024-07-17 (×2): 1 mg via INTRAVENOUS

## 2024-07-17 MED ORDER — CEFUROXIME OPHTHALMIC INJECTION 1 MG/0.1 ML
INJECTION | OPHTHALMIC | Status: DC | PRN
  Administered 2024-07-17: 1 mg via INTRACAMERAL

## 2024-07-17 MED ORDER — SIGHTPATH DOSE#1 BSS IO SOLN
INTRAOCULAR | Status: DC | PRN
  Administered 2024-07-17: 15 mL via INTRAOCULAR

## 2024-07-17 MED ORDER — LIDOCAINE HCL (PF) 2 % IJ SOLN
INTRAOCULAR | Status: DC | PRN
  Administered 2024-07-17: 2 mL

## 2024-07-17 MED ORDER — SIGHTPATH DOSE#1 BSS IO SOLN
INTRAOCULAR | Status: DC | PRN
  Administered 2024-07-17: 72 mL via OPHTHALMIC

## 2024-07-17 MED ORDER — PHENYLEPHRINE HCL 10 % OP SOLN
1.0000 [drp] | OPHTHALMIC | Status: AC | PRN
  Administered 2024-07-17 (×3): 1 [drp] via OPHTHALMIC

## 2024-07-17 MED ORDER — TETRACAINE HCL 0.5 % OP SOLN
1.0000 [drp] | OPHTHALMIC | Status: DC | PRN
  Administered 2024-07-17 (×3): 1 [drp] via OPHTHALMIC

## 2024-07-17 MED ORDER — MIDAZOLAM HCL 2 MG/2ML IJ SOLN
INTRAMUSCULAR | Status: AC
  Filled 2024-07-17: qty 2

## 2024-07-17 MED ORDER — LACTATED RINGERS IV SOLN: INTRAVENOUS | Status: DC

## 2024-07-17 MED ORDER — BRIMONIDINE TARTRATE-TIMOLOL 0.2-0.5 % OP SOLN
OPHTHALMIC | Status: DC | PRN
  Administered 2024-07-17: 1 [drp] via OPHTHALMIC

## 2024-07-17 MED ORDER — CYCLOPENTOLATE HCL 2 % OP SOLN
1.0000 [drp] | OPHTHALMIC | Status: AC | PRN
  Administered 2024-07-17 (×3): 1 [drp] via OPHTHALMIC

## 2024-07-17 MED ORDER — FENTANYL CITRATE (PF) 100 MCG/2ML IJ SOLN
INTRAMUSCULAR | Status: AC
  Filled 2024-07-17: qty 2

## 2024-07-17 MED ORDER — SIGHTPATH DOSE#1 NA HYALUR & NA CHOND-NA HYALUR IO KIT
PACK | INTRAOCULAR | Status: DC | PRN
  Administered 2024-07-17: 1 via OPHTHALMIC

## 2024-07-17 NOTE — Op Note (Signed)
 LOCATION:  [facility]   PREOPERATIVE DIAGNOSIS:  Nuclear sclerotic cataract of the left eye.  H25.12  POSTOPERATIVE DIAGNOSIS:  Nuclear sclerotic cataract of the left eye.   PROCEDURE:  Phacoemulsification with Toric posterior chamber intraocular lens placement of the left eye.  Ultrasound time: Procedures: PHACOEMULSIFICATION, CATARACT, WITH IOL INSERTION (Left)  LENS:   Implant Name Type Inv. Item Serial No. Manufacturer Lot No. LRB No. Used Action  LENS IOL PANO PRO TORC 15.0 - [id number]  LENS IOL PANO PRO TORC 15.0 PXYAT3 SIGHTPATH  Left 1 Wasted  LENS IOL PANO PRO TORC 15.0 - [id number]  LENS IOL PANO PRO TORC 15.0 PXYAT3 SIGHTPATH  Left 1 Implanted  First IOL discarded    Second Panoptix Toric intraocular lens inserted with 1.5 diopters of cylindrical power with axis orientation at 99 degrees.     SURGEON:  Dene FABIENE Etienne, MD   ANESTHESIA:  Topical with tetracaine  drops and 2% Xylocaine  jelly, augmented with 1% preservative-free intracameral lidocaine .  COMPLICATIONS:  None.   DESCRIPTION OF PROCEDURE:  The patient was identified in the holding room and transported to the operating suite and placed in the supine position under the operating microscope.  The left eye was identified as the operative eye, and it was prepped and draped in the usual sterile ophthalmic fashion.    A clear-corneal paracentesis incision was made at the 1:30 position.  0.5 ml of preservative-free 1% lidocaine  was injected into the anterior chamber. The anterior chamber was filled with Viscoat.  A 2.4 millimeter near clear corneal incision was then made at the 10:30 position.  A cystotome and capsulorrhexis forceps were then used to make a curvilinear capsulorrhexis.  Hydrodissection and hydrodelineation were then performed using balanced salt solution.   Phacoemulsification was then used in stop and chop fashion to remove the lens, nucleus and epinucleus.  The remaining cortex was aspirated  using the irrigation and aspiration handpiece.  Provisc viscoelastic was then placed into the capsular bag to distend it for lens placement.  The Verion digital marker was used to align the implant at the intended axis.   A Toric lens was then injected into the capsular bag.  It was rotated clockwise until the axis marks on the lens were approximately 15 degrees in the counterclockwise direction to the intended alignment.  The viscoelastic was aspirated from the eye using the irrigation aspiration handpiece.  Then, a Koch spatula through the sideport incision was used to rotate the lens in a clockwise direction until the axis markings of the intraocular lens were lined up with the Verion alignment.  Balanced salt solution was then used to hydrate the wounds. Cefuroxime  0.1 ml of a 10mg /ml solution was injected into the anterior chamber for a dose of 1 mg of intracameral antibiotic at the completion of the case.    The eye was noted to have a physiologic pressure and there was no wound leak noted.   Timolol  and Brimonidine  drops were applied to the eye.  The patient was taken to the recovery room in stable condition having had no complications of anesthesia or surgery.  Jonne Rote 07/17/2024, 9:37 AM

## 2024-07-17 NOTE — Transfer of Care (Signed)
 Immediate Anesthesia Transfer of Care Note  Patient: John Long.  Procedure(s) Performed: PHACOEMULSIFICATION, CATARACT, WITH IOL INSERTION (Left)  Patient Location: PACU  Anesthesia Type: MAC  Level of Consciousness: awake, alert  and patient cooperative  Airway and Oxygen Therapy: Patient Spontanous Breathing and Patient connected to supplemental oxygen  Post-op Assessment: Post-op Vital signs reviewed, Patient's Cardiovascular Status Stable, Respiratory Function Stable, Patent Airway and No signs of Nausea or vomiting  Post-op Vital Signs: Reviewed and stable  Complications: No notable events documented.

## 2024-07-17 NOTE — H&P (Signed)
 " [facility]   Primary Care Physician:  Rudolpho Norleen BIRCH, MD Ophthalmologist: Dr. Dene Etienne  Pre-Procedure History & Physical: HPI:  John Moch. is a 84 y.o. male here for ophthalmic surgery.   Past Medical History:  Diagnosis Date   Allergy    Arthritis    Enlarged prostate    Family history of colon cancer    GERD (gastroesophageal reflux disease)    Hypertension     Past Surgical History:  Procedure Laterality Date   BLEPHAROPLASTY Bilateral    CATARACT EXTRACTION W/PHACO Right 07/03/2024   Procedure: PHACOEMULSIFICATION, CATARACT, WITH IOL INSERTION 4.99 00:26.3;  Surgeon: Etienne Dene, MD;  Location: [facility];  Service: Ophthalmology;  Laterality: Right;   COLONOSCOPY WITH PROPOFOL      HERNIA REPAIR Bilateral 07/24/2017   INGUINAL HERNIA REPAIR Left 09/05/2018   Medium Bard Perfix Plug; RECURRENT HERNIA REPAIR INGUINAL ADULT-LEFT;  Surgeon: Dessa Reyes ORN, MD;  Location: [facility] ORS;  Service: General;  Laterality: Left;   OPEN REDUCTION INTERNAL FIXATION (ORIF) DISTAL RADIAL FRACTURE Left 02/08/2018   Procedure: OPEN REDUCTION INTERNAL FIXATION (ORIF) DISTAL RADIAL FRACTURE;  Surgeon: Kathlynn Sharper, MD;  Location: [facility] ORS;  Service: Orthopedics;  Laterality: Left;   TONSILLECTOMY  1949    Prior to Admission medications  Medication Sig Start Date End Date Taking? Authorizing Provider  acetaminophen  (TYLENOL ) 500 MG tablet Take 500 mg by mouth every 6 (six) hours as needed.   Yes [provider]  albuterol (VENTOLIN HFA) 108 (90 Base) MCG/ACT inhaler Inhale into the lungs every 6 (six) hours as needed for wheezing or shortness of breath.   Yes [provider]  amLODipine (NORVASC) 5 MG tablet Take 5 mg by mouth daily.   Yes [provider]  cholecalciferol (VITAMIN D3) 25 MCG (1000 UT) tablet Take 1,000 Units by mouth daily.   Yes [provider]  cyanocobalamin (VITAMIN B12) 1000 MCG tablet  Take 1,000 mcg by mouth daily.   Yes [provider]  doxazosin (CARDURA) 2 MG tablet Take 8 mg by mouth daily.  09/26/18  Yes [provider]  Dupilumab (DUPIXENT) 300 MG/2ML SOAJ Inject into the skin once a week.   Yes [provider]  finasteride (PROSCAR) 5 MG tablet Take 5 mg by mouth daily.   Yes [provider]  ibuprofen (ADVIL) 200 MG tablet Take 200 mg by mouth every 6 (six) hours as needed.   Yes [provider]  ketoconazole (NIZORAL) 2 % shampoo Apply 1 application  topically 2 (two) times a week.   Yes [provider]  Multiple Vitamins-Minerals (ICAPS AREDS 2 PO) Take 1 capsule by mouth daily.   Yes [provider]  omeprazole (PRILOSEC) 20 MG capsule Take 20 mg by mouth daily.   Yes [provider]  sildenafil (REVATIO) 20 MG tablet Take 40-60 mg by mouth daily as needed.   Yes [provider]  telmisartan (MICARDIS) 80 MG tablet Take 80 mg by mouth daily.   Yes [provider]  triamcinolone cream (KENALOG) 0.1 % Apply 1 application  topically daily.   Yes [provider]    Allergies as of 04/25/2024 - Review Complete 09/29/2023  Allergen Reaction Noted   Vasotec [enalapril maleate] Cough 07/31/2018    Family History  Problem Relation Age of Onset   Colon cancer Father 38   Dementia Mother    Esophageal cancer Neg Hx    Stomach cancer Neg Hx  Rectal cancer Neg Hx     Social History   Socioeconomic History   Marital status: Married    Spouse name: Not on file   Number of children: Not on file   Years of education: Not on file   Highest education level: Not on file  Occupational History   Not on file  Tobacco Use   Smoking status: Former    Current packs/day: 0.00    Average packs/day: 1 pack/day for 20.0 years (20.0 ttl pk-yrs)    Types: Cigarettes    Start date: 06/20/1977    Quit date: 06/20/1997    Years since quitting: 27.0   Smokeless tobacco: Never   Substance and Sexual Activity   Alcohol use: Yes    Alcohol/week: 14.0 standard drinks of alcohol    Types: 14 Glasses of wine per week    Comment: 1-2/day   Drug use: No   Sexual activity: Yes  Other Topics Concern   Not on file  Social History Narrative   Not on file   Social Drivers of Health   Tobacco Use: Medium Risk (07/17/2024)   Patient History    Smoking Tobacco Use: Former    Smokeless Tobacco Use: Never    Passive Exposure: Not on file  Financial Resource Strain: Low Risk  (08/22/2023)   Received from [facility]   Overall Financial Resource Strain (CARDIA)    Difficulty of Paying Living Expenses: Not hard at all  Food Insecurity: No Food Insecurity (08/22/2023)   Received from [facility]   Epic    Within the past 12 months, you worried that your food would run out before you got the money to buy more.: Never true    Within the past 12 months, the food you bought just didn't last and you didn't have money to get more.: Never true  Transportation Needs: No Transportation Needs (08/22/2023)   Received from [facility]    In the past 12 months, has lack of transportation kept you from medical appointments or from getting medications?: No    Lack of Transportation (Non-Medical): No  Physical Activity: Not on file  Stress: Not on file  Social Connections: Not on file  Intimate Partner Violence: Not on file  Depression (EYV7-0): Not on file  Alcohol Screen: Not on file  Housing: Unknown (08/22/2023)   Received from [facility]   Epic    In the last 12 months, was there a time when you were not able to pay the mortgage or rent on time?: No    Number of Times Moved in the Last Year: Not on file    At any time in the past 12 months, were you homeless or living in a shelter (including now)?: No  Utilities: Not At Risk (08/22/2023)   Received from [facility] Utilities    Threatened with loss of utilities: No  Health Literacy: Not on file    Review of Systems: See HPI, otherwise negative ROS  Physical Exam: BP (!) 146/84   Pulse 68   Temp 98.4 F (36.9 C) (Temporal)   Resp 16   Ht 5' 7 (1.702 m)   Wt 75.8 kg   SpO2 99%   BMI 26.16 kg/m  General:   Alert,  pleasant and cooperative in NAD Head:  Normocephalic and atraumatic. Lungs:  Clear to auscultation.    Heart:  Regular rate  and rhythm.   Impression/Plan: John R Weyrauch Jr. is here for ophthalmic surgery.  Risks, benefits, limitations, and alternatives regarding ophthalmic surgery have been reviewed with the patient.  Questions have been answered.  All parties agreeable.   MITTIE GASKIN, MD  07/17/2024, 9:05 AM  "

## 2024-07-17 NOTE — Anesthesia Postprocedure Evaluation (Signed)
"   Anesthesia Post Note  Patient: John Long.  Procedure(s) Performed: PHACOEMULSIFICATION, CATARACT, WITH IOL INSERTION (Left)  Patient location during evaluation: PACU Anesthesia Type: MAC Level of consciousness: awake and alert Pain management: pain level controlled Vital Signs Assessment: post-procedure vital signs reviewed and stable Respiratory status: spontaneous breathing, nonlabored ventilation, respiratory function stable and patient connected to nasal cannula oxygen Cardiovascular status: blood pressure returned to baseline and stable Postop Assessment: no apparent nausea or vomiting Anesthetic complications: no   No notable events documented.   Last Vitals:  Vitals:   07/17/24 0939 07/17/24 0944  BP: 123/79 134/85  Pulse: 61 60  Resp:  11  Temp: (!) 36.2 C (!) 36.2 C  SpO2: 97% 95%    Last Pain:  Vitals:   07/17/24 0944  TempSrc:   PainSc: 0-No pain                 Fairy A Bailey Kolbe      "

## 2024-07-17 NOTE — Anesthesia Preprocedure Evaluation (Signed)
"                                    Anesthesia Evaluation  Patient identified by MRN, date of birth, ID band Patient awake    Reviewed: Allergy & Precautions, H&P , NPO status , Patient's Chart, lab work & pertinent test results  Airway Mallampati: II  TM Distance: >3 FB Neck ROM: Full    Dental no notable dental hx.    Pulmonary neg pulmonary ROS, former smoker   Pulmonary exam normal breath sounds clear to auscultation       Cardiovascular hypertension, negative cardio ROS Normal cardiovascular exam Rhythm:Regular Rate:Normal     Neuro/Psych negative neurological ROS  negative psych ROS   GI/Hepatic negative GI ROS, Neg liver ROS,,,  Endo/Other  negative endocrine ROS    Renal/GU negative Renal ROS  negative genitourinary   Musculoskeletal negative musculoskeletal ROS (+)    Abdominal   Peds negative pediatric ROS (+)  Hematology negative hematology ROS (+)   Anesthesia Other Findings   Reproductive/Obstetrics negative OB ROS                              Anesthesia Physical Anesthesia Plan  ASA: 2  Anesthesia Plan: MAC   Post-op Pain Management:    Induction: Intravenous  PONV Risk Score and Plan:   Airway Management Planned:   Additional Equipment:   Intra-op Plan:   Post-operative Plan: Extubation in OR  Informed Consent: I have reviewed the patients History and Physical, chart, labs and discussed the procedure including the risks, benefits and alternatives for the proposed anesthesia with the patient or authorized representative who has indicated his/her understanding and acceptance.     Dental advisory given  Plan Discussed with: CRNA  Anesthesia Plan Comments:         Anesthesia Quick Evaluation  "
